# Patient Record
Sex: Male | Born: 1945 | Race: White | Hispanic: No | Marital: Married | Smoking: Former smoker
Health system: Southern US, Community
[De-identification: ages and names within clinical notes are randomized; demographics above are authoritative.]

## PROBLEM LIST (undated history)

## (undated) DIAGNOSIS — I10 Essential (primary) hypertension: Secondary | ICD-10-CM

## (undated) DIAGNOSIS — E7800 Pure hypercholesterolemia, unspecified: Secondary | ICD-10-CM

## (undated) DIAGNOSIS — F1020 Alcohol dependence, uncomplicated: Secondary | ICD-10-CM

## (undated) DIAGNOSIS — J45909 Unspecified asthma, uncomplicated: Secondary | ICD-10-CM

## (undated) HISTORY — PX: UMBILICAL HERNIA REPAIR: SHX196

## (undated) HISTORY — PX: INGUINAL HERNIA REPAIR: SUR1180

---

## 2003-01-18 ENCOUNTER — Ambulatory Visit (HOSPITAL_COMMUNITY): Admission: RE | Admit: 2003-01-18 | Discharge: 2003-01-18 | Payer: Self-pay | Admitting: Gastroenterology

## 2008-12-02 ENCOUNTER — Ambulatory Visit: Payer: Self-pay | Admitting: Diagnostic Radiology

## 2008-12-02 ENCOUNTER — Emergency Department (HOSPITAL_BASED_OUTPATIENT_CLINIC_OR_DEPARTMENT_OTHER): Admission: EM | Admit: 2008-12-02 | Discharge: 2008-12-02 | Payer: Self-pay | Admitting: Emergency Medicine

## 2009-09-13 ENCOUNTER — Emergency Department (HOSPITAL_BASED_OUTPATIENT_CLINIC_OR_DEPARTMENT_OTHER): Admission: EM | Admit: 2009-09-13 | Discharge: 2009-09-13 | Payer: Self-pay | Admitting: Emergency Medicine

## 2010-08-30 LAB — BASIC METABOLIC PANEL
CO2: 28 mEq/L (ref 19–32)
Calcium: 10.3 mg/dL (ref 8.4–10.5)
Chloride: 98 mEq/L (ref 96–112)
GFR calc Af Amer: >60 mL/min (ref >60)
Glucose, Bld: 113 mg/dL — ABNORMAL HIGH (ref 70–99)
Sodium: 139 mEq/L (ref 135–145)

## 2010-08-30 LAB — DIFFERENTIAL
Basophils Absolute: 0.1 10*3/uL (ref 0.0–0.1)
Basophils Relative: 1 % (ref 0–1)
Eosinophils Absolute: 0.2 10*3/uL (ref 0.0–0.7)
Eosinophils Relative: 2 % (ref 0–5)
Lymphocytes Relative: 31 % (ref 12–46)
Lymphs Abs: 2.4 K/uL (ref 0.7–4.0)
Monocytes Absolute: 0.6 10*3/uL (ref 0.1–1.0)
Monocytes Relative: 7 % (ref 3–12)
Neutro Abs: 4.4 10*3/uL (ref 1.7–7.7)
Neutrophils Relative %: 59 % (ref 43–77)

## 2010-08-30 LAB — URINALYSIS, ROUTINE W REFLEX MICROSCOPIC
Bilirubin Urine: NEGATIVE
Glucose, UA: NEGATIVE mg/dL
Hgb urine dipstick: NEGATIVE
Ketones, ur: NEGATIVE mg/dL
Nitrite: NEGATIVE
Protein, ur: NEGATIVE mg/dL
Specific Gravity, Urine: 1.011 (ref 1.005–1.030)
Urobilinogen, UA: 0.2 mg/dL (ref 0.0–1.0)
pH: 6.5 (ref 5.0–8.0)

## 2010-08-30 LAB — CBC
HCT: 49.5 % (ref 39.0–52.0)
Hemoglobin: 16.9 g/dL (ref 13.0–17.0)
MCHC: 34.1 g/dL (ref 30.0–36.0)
MCV: 96.1 fL (ref 78.0–100.0)
Platelets: 268 K/uL (ref 150–400)
RBC: 5.15 MIL/uL (ref 4.22–5.81)
RDW: 12.6 % (ref 11.5–15.5)
WBC: 7.7 K/uL (ref 4.0–10.5)

## 2010-08-30 LAB — PROTIME-INR
INR: 0.9 (ref 0.00–1.49)
Prothrombin Time: 12.1 s (ref 11.6–15.2)

## 2010-08-30 LAB — APTT: aPTT: 23 seconds — ABNORMAL LOW (ref 24–37)

## 2010-08-30 LAB — BASIC METABOLIC PANEL WITH GFR
BUN: 11 mg/dL (ref 6–23)
Creatinine, Ser: 0.9 mg/dL (ref 0.4–1.5)
GFR calc non Af Amer: >60 mL/min (ref >60)
Potassium: 3.9 meq/L (ref 3.5–5.1)

## 2010-10-09 NOTE — Op Note (Signed)
NAME:  Jay Wolfe, Jay Wolfe                         ACCOUNT NO.:  [id number]   MEDICAL RECORD NO.:  [id number]                   PATIENT TYPE:  AMB   LOCATION:  ENDO                                 FACILITY:  [facility]   PHYSICIAN:  Anselmo Rod, M.D.               DATE OF BIRTH:  1946/05/08   DATE OF PROCEDURE:  01/18/2003  DATE OF DISCHARGE:                                 OPERATIVE REPORT   PROCEDURE:  Colonoscopy/endoscopy.   ENDOSCOPIST:  Anselmo Rod, M.D.   INSTRUMENT:  Olympus videocoloscope.   INDICATIONS FOR PROCEDURE:  This 65 year old white male with a history of  rectal bleeding a questionable family history of colon cancer in his father.  Rule out colonic polyps, masses, etc.  Preprocedure preparation and an  informed consent was received from the patient.  The patient fasted for 8  hours prior to the procedure and prepped with a bottle of magnesium citrate  in a gallon of GoLYTELY the night prior to the procedure.   PREPROCEDURE PHYSICAL:  VITAL SIGNS: The patient with stable vital signs.  NECK:  Supple.  CHEST:  Clear to auscultation.  HEART:  S1, S2 regular.  ABDOMEN:  Soft with normal bowel sounds.   DESCRIPTION OF PROCEDURE:  The patient was placed in the left lateral  decubitus position and sedated with 75 mg of Demerol and 7.5 mg of Versed  intravenously.  Once the patient was adequately sedated he was maintained on  low-flow oxygen and continuous cardiac monitoring.  The Olympus  videocolonoscope was advanced from the rectum to cecum without difficulty.  The patient had an excellent prep. No masses, polyps, erosions, ulcerations  or a diverticula were seen.  The appendiceal orifice and ileocecal valve  were clearly visualized and photographed.   The patient had prominent inflamed hemorrhoids seen on retroflexion in the  rectum which I presume is the source of his rectal bleeding.  No masses,  polyps or diverticula were present.   IMPRESSION:  Prominent  internal hemorrhoid otherwise normal colonoscopy up  to the cecum.                  RECOMMENDATIONS:  1. Anusol HC 2.5% suppositories have been advised 1 per rectum q.h.s..  2. A high fiber diet with liberal fluid intake.  3. Repeat colonoscopy screening in the next 5 years unless the patient     develops abnormal symptoms in the interim.  4. Outpatient follow up if need arises in the future.                                               Anselmo Rod, M.D.    JNM/MEDQ  D:  01/18/2003  T:  01/19/2003  Job:  [id number]   cc:  Camie Patience  [address] [place]. [address]  [place] [address]  Fax: [phone]

## 2013-01-28 ENCOUNTER — Emergency Department (HOSPITAL_COMMUNITY)
Admission: EM | Admit: 2013-01-28 | Discharge: 2013-01-28 | Disposition: A | Discharge status: Home / Self Care | Payer: Medicare Other | Attending: Emergency Medicine | Admitting: Emergency Medicine

## 2013-01-28 ENCOUNTER — Emergency Department (HOSPITAL_COMMUNITY): Payer: Medicare Other

## 2013-01-28 ENCOUNTER — Encounter (HOSPITAL_COMMUNITY): Payer: Self-pay

## 2013-01-28 DIAGNOSIS — F1010 Alcohol abuse, uncomplicated: Secondary | ICD-10-CM | POA: Insufficient documentation

## 2013-01-28 DIAGNOSIS — S0003XA Contusion of scalp, initial encounter: Secondary | ICD-10-CM | POA: Insufficient documentation

## 2013-01-28 DIAGNOSIS — S0990XA Unspecified injury of head, initial encounter: Secondary | ICD-10-CM

## 2013-01-28 DIAGNOSIS — F10929 Alcohol use, unspecified with intoxication, unspecified: Secondary | ICD-10-CM

## 2013-01-28 DIAGNOSIS — W108XXA Fall (on) (from) other stairs and steps, initial encounter: Secondary | ICD-10-CM | POA: Insufficient documentation

## 2013-01-28 DIAGNOSIS — Z79899 Other long term (current) drug therapy: Secondary | ICD-10-CM | POA: Insufficient documentation

## 2013-01-28 DIAGNOSIS — Y92009 Unspecified place in unspecified non-institutional (private) residence as the place of occurrence of the external cause: Secondary | ICD-10-CM | POA: Insufficient documentation

## 2013-01-28 DIAGNOSIS — Y9389 Activity, other specified: Secondary | ICD-10-CM | POA: Insufficient documentation

## 2013-01-28 DIAGNOSIS — E7800 Pure hypercholesterolemia, unspecified: Secondary | ICD-10-CM | POA: Insufficient documentation

## 2013-01-28 DIAGNOSIS — I10 Essential (primary) hypertension: Secondary | ICD-10-CM | POA: Insufficient documentation

## 2013-01-28 HISTORY — DX: Alcohol dependence, uncomplicated: F10.20

## 2013-01-28 HISTORY — DX: Essential (primary) hypertension: I10

## 2013-01-28 HISTORY — DX: Pure hypercholesterolemia, unspecified: E78.00

## 2013-01-28 NOTE — ED Notes (Signed)
Pt from home.  Per EMS, pt has had 12 beers and fell down 3 stairs.  Pt with hematoma in the area of the L occipital lobe.  EMS reports pt ambulatory at the scene.  No spine pain.  Neuro intact.  GCS 15.  Pt c/o headache.

## 2013-01-28 NOTE — ED Provider Notes (Signed)
CSN: [id number]     Arrival date & time 01/28/13  1755 History   First MD Initiated Contact with Patient 01/28/13 1759     Chief Complaint  Patient presents with  . Fall    The history is provided by the patient.   as reported that the patient was walking up the steps to his house he fell backward striking his posterior scalp.  No laceration.  Hematoma noted.  The patient is ambulatory at the scene.  The patient was not immobilized prior to arrival.  On arrival of emergency apartment he reports pain in his posterior scalp as well as his neck.  He denies weakness of his upper lower extremities.  His pain is mild to moderate in severity.  His pain is worsened by palpation.  Nothing improves his pain.  He denies chest pain shortness of breath.  He has no abdominal pain.  No recent illness.  He does admit to drinking alcohol today.  Past Medical History  Diagnosis Date  . Alcoholic   . Hypertension   . High cholesterol    History reviewed. No pertinent past surgical history. No family history on file. History  Substance Use Topics  . Smoking status: Never Smoker   . Smokeless tobacco: Not on file  . Alcohol Use: Yes    Review of Systems  All other systems reviewed and are negative.    Allergies  Review of patient's allergies indicates no known allergies.  Home Medications   Current Outpatient Rx  Name  Route  Sig  Dispense  Refill  . benzonatate (TESSALON) 200 MG capsule   Oral   Take 200 mg by mouth 3 (three) times daily as needed for cough.         . hydrochlorothiazide (HYDRODIURIL) 25 MG tablet   Oral   Take 25 mg by mouth daily.         . metoprolol succinate (TOPROL-XL) 50 MG 24 hr tablet   Oral   Take 50 mg by mouth daily. Take with or immediately following a meal.         . pravastatin (PRAVACHOL) 40 MG tablet   Oral   Take 40 mg by mouth every evening.         . venlafaxine XR (EFFEXOR-XR) 37.5 MG 24 hr capsule   Oral   Take 37.5 mg by mouth daily.          BP 134/76  Temp(Src) 97.6 F (36.4 C) (Oral)  Resp 20  Ht 5' 8.5" (1.74 m)  Wt 222 lb (100.699 kg)  BMI 33.26 kg/m2  SpO2 97% Physical Exam  Nursing note and vitals reviewed. Constitutional: He is oriented to person, place, and time. He appears well-developed and well-nourished.  HENT:  Head: Normocephalic and atraumatic.  Large boggy hematoma of his posterior scalp.  No laceration.  Eyes: EOM are normal.  Neck: Neck supple.  Lysis collar on arrival to the emergency apartment.  Mild cervical and paracervical tenderness without step-off.  Cardiovascular: Normal rate, regular rhythm, normal heart sounds and intact distal pulses.   Pulmonary/Chest: Effort normal and breath sounds normal. No respiratory distress.  Abdominal: Soft. He exhibits no distension. There is no tenderness.  Genitourinary: Rectum normal.  Musculoskeletal: Normal range of motion.  Thoracic or lumbar spinal tenderness.  Full range of motion bilateral hips knees and ankles.  Full range of motion bilateral wrists elbows and shoulders.  No tenderness his clavicles  Neurological: He is alert and oriented to person,  place, and time.  Skin: Skin is warm and dry.  Psychiatric: He has a normal mood and affect. Judgment normal.    ED Course  Procedures (including critical care time) Labs Review Labs Reviewed - No data to display Imaging Review Ct Head Wo Contrast  01/28/2013   CLINICAL DATA:  Fall with injury to the back of the head. Amnestic to event.  EXAM: CT HEAD WITHOUT CONTRAST  CT CERVICAL SPINE WITHOUT CONTRAST  TECHNIQUE: Multidetector CT imaging of the head and cervical spine was performed following the standard protocol without intravenous contrast. Multiplanar CT image reconstructions of the cervical spine were also generated.  COMPARISON:  12/02/2008  FINDINGS: CT HEAD FINDINGS  Left parietal scalp hematoma. The brainstem, cerebellum, cerebral peduncles, thalamus, basal ganglia, basilar cisterns, and  ventricular system appear within normal limits. No intracranial hemorrhage, mass lesion, or acute CVA. 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  CT CERVICAL SPINE FINDINGS  Cervical spondylosis and degenerative disk disease noted with loss of disc height at C3-4 and C6-7, and uncinate spurring causing right foraminal stenosis at C6-7. No fracture or significant malalignment. Advanced degenerative findings at the anterior C1-2 articulation.  IMPRESSION: CT HEAD IMPRESSION  1. Left parietal scalp hematoma. No acute intracranial findings. 2. 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  CT CERVICAL SPINE IMPRESSION  1. Cervical spondylosis and degenerative disc disease, causing right foraminal stenosis at C6-7. No fracture or acute subluxation in the cervical spine identified.   Electronically Signed   By: Herbie Baltimore   On: 01/28/2013 20:09   Ct Cervical Spine Wo Contrast  01/28/2013   CLINICAL DATA:  Fall with injury to the back of the head. Amnestic to event.  EXAM: CT HEAD WITHOUT CONTRAST  CT CERVICAL SPINE WITHOUT CONTRAST  TECHNIQUE: Multidetector CT imaging of the head and cervical spine was performed following the standard protocol without intravenous contrast. Multiplanar CT image reconstructions of the cervical spine were also generated.  COMPARISON:  12/02/2008  FINDINGS: CT HEAD FINDINGS  Left parietal scalp hematoma. The brainstem, cerebellum, cerebral peduncles, thalamus, basal ganglia, basilar cisterns, and ventricular system appear within normal limits. No intracranial hemorrhage, mass lesion, or acute CVA. 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  CT CERVICAL SPINE FINDINGS  Cervical spondylosis and degenerative disk disease noted with loss of disc height at C3-4 and C6-7, and uncinate spurring causing right foraminal stenosis at C6-7. No fracture or significant malalignment. Advanced degenerative findings at the anterior C1-2 articulation.  IMPRESSION: CT HEAD  IMPRESSION  1. Left parietal scalp hematoma. No acute intracranial findings. 2. 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  CT CERVICAL SPINE IMPRESSION  1. Cervical spondylosis and degenerative disc disease, causing right foraminal stenosis at C6-7. No fracture or acute subluxation in the cervical spine identified.   Electronically Signed   By: Herbie Baltimore   On: 01/28/2013 20:09   I personally reviewed the imaging tests through PACS system I reviewed available ER/hospitalization records through the EMR   MDM   1. Head injury, initial encounter   2. Alcohol intoxication    CT head C-spine without acute pathology.  Patient in Lipitor in the emergency department.  I spoke with the patient's wife and updated her.  Patient reports that he will take a taxicab home.  He came in later on ERCP.  No other injury.    Lyanne Co, MD 01/28/13 2046

## 2013-01-28 NOTE — ED Notes (Signed)
Pt transported to CT ?

## 2013-04-27 ENCOUNTER — Emergency Department (HOSPITAL_COMMUNITY)
Admission: EM | Admit: 2013-04-27 | Discharge: 2013-04-27 | Disposition: A | Discharge status: Home / Self Care | Payer: Medicare Other | Attending: Emergency Medicine | Admitting: Emergency Medicine

## 2013-04-27 ENCOUNTER — Emergency Department (HOSPITAL_COMMUNITY): Payer: Medicare Other

## 2013-04-27 ENCOUNTER — Encounter (HOSPITAL_COMMUNITY): Payer: Self-pay | Admitting: Emergency Medicine

## 2013-04-27 DIAGNOSIS — K08109 Complete loss of teeth, unspecified cause, unspecified class: Secondary | ICD-10-CM | POA: Insufficient documentation

## 2013-04-27 DIAGNOSIS — S0081XA Abrasion of other part of head, initial encounter: Secondary | ICD-10-CM

## 2013-04-27 DIAGNOSIS — F10929 Alcohol use, unspecified with intoxication, unspecified: Secondary | ICD-10-CM

## 2013-04-27 DIAGNOSIS — S0083XA Contusion of other part of head, initial encounter: Secondary | ICD-10-CM

## 2013-04-27 DIAGNOSIS — S0003XA Contusion of scalp, initial encounter: Secondary | ICD-10-CM | POA: Insufficient documentation

## 2013-04-27 DIAGNOSIS — IMO0002 Reserved for concepts with insufficient information to code with codable children: Secondary | ICD-10-CM | POA: Insufficient documentation

## 2013-04-27 DIAGNOSIS — F1010 Alcohol abuse, uncomplicated: Secondary | ICD-10-CM | POA: Insufficient documentation

## 2013-04-27 DIAGNOSIS — Y9301 Activity, walking, marching and hiking: Secondary | ICD-10-CM | POA: Insufficient documentation

## 2013-04-27 DIAGNOSIS — Y92009 Unspecified place in unspecified non-institutional (private) residence as the place of occurrence of the external cause: Secondary | ICD-10-CM | POA: Insufficient documentation

## 2013-04-27 DIAGNOSIS — Z79899 Other long term (current) drug therapy: Secondary | ICD-10-CM | POA: Insufficient documentation

## 2013-04-27 DIAGNOSIS — I10 Essential (primary) hypertension: Secondary | ICD-10-CM | POA: Insufficient documentation

## 2013-04-27 DIAGNOSIS — E7800 Pure hypercholesterolemia, unspecified: Secondary | ICD-10-CM | POA: Insufficient documentation

## 2013-04-27 LAB — CBC
Hemoglobin: 15.5 g/dL (ref 13.0–17.0)
MCH: 35.5 pg — ABNORMAL HIGH (ref 26.0–34.0)
MCV: 99.1 fL (ref 78.0–100.0)
Platelets: 204 10*3/uL (ref 150–400)
RBC: 4.37 MIL/uL (ref 4.22–5.81)
WBC: 4.1 10*3/uL (ref 4.0–10.5)

## 2013-04-27 LAB — BASIC METABOLIC PANEL
CO2: 25 mEq/L (ref 19–32)
Calcium: 9.1 mg/dL (ref 8.4–10.5)
Chloride: 102 mEq/L (ref 96–112)
Creatinine, Ser: 1.02 mg/dL (ref 0.50–1.35)
Glucose, Bld: 109 mg/dL — ABNORMAL HIGH (ref 70–99)

## 2013-04-27 MED ORDER — OXYCODONE-ACETAMINOPHEN 5-325 MG PO TABS
1.0000 | ORAL_TABLET | Freq: Once | ORAL | Status: AC
  Administered 2013-04-27: 1 via ORAL
  Filled 2013-04-27: qty 1

## 2013-04-27 NOTE — ED Notes (Signed)
Bed: [facility] Expected date:  Expected time:  Means of arrival:  Comments: EMS-ETOH 

## 2013-04-27 NOTE — ED Notes (Signed)
per [facility], pt intoxicated, apartment complex residents report that pt fell w/facial trauma w/possible dental damage. Pt ambulates but very unsteady. Upon EMS arrival, pt was inside house, alert, awake, responsive, alert and oriented x 4. Pt is very lethargic, but neuro checks intact. No active bleeding, but some facial edema/swelling.

## 2013-04-27 NOTE — ED Provider Notes (Signed)
CSN: [id number]     Arrival date & time 04/27/13  1802 History   First MD Initiated Contact with Patient 04/27/13 1820     Chief Complaint  Patient presents with  . Alcohol Intoxication  . Fall    facial injury with possible dental damage   (Consider location/radiation/quality/duration/timing/severity/associated sxs/prior Treatment) HPI Patient reports he was walking today when he tripped on file and injured his face.  He was drinking alcohol today.  He reports pain to his axilla.  He denies weakness of his upper lower extremities.  His pain is moderate in severity this time.  He reports mild neck pain.  Denies chest pain shortness breath.  No abdominal pain.  He denies use of anticoagulants.  Her preceding chest pain or palpitations.   Past Medical History  Diagnosis Date  . Alcoholic   . Hypertension   . High cholesterol    History reviewed. No pertinent past surgical history. History reviewed. No pertinent family history. History  Substance Use Topics  . Smoking status: Never Smoker   . Smokeless tobacco: Not on file  . Alcohol Use: Yes    Review of Systems  All other systems reviewed and are negative.    Allergies  Review of patient's allergies indicates no known allergies.  Home Medications   Current Outpatient Rx  Name  Route  Sig  Dispense  Refill  . metoprolol succinate (TOPROL-XL) 50 MG 24 hr tablet   Oral   Take 50 mg by mouth daily. Take with or immediately following a meal.         . benzonatate (TESSALON) 200 MG capsule   Oral   Take 200 mg by mouth 3 (three) times daily as needed for cough.         . pravastatin (PRAVACHOL) 40 MG tablet   Oral   Take 40 mg by mouth every evening.         . venlafaxine XR (EFFEXOR-XR) 37.5 MG 24 hr capsule   Oral   Take 37.5 mg by mouth daily.          BP 135/68  Pulse 68  Temp(Src) 97.9 F (36.6 C) (Oral)  Resp 18  SpO2 100% Physical Exam  Nursing note and vitals reviewed. Constitutional: He is  oriented to person, place, and time. He appears well-developed and well-nourished.  HENT:  Head: Normocephalic.  Multiple superficial facial abrasions involving his nose, his maxilla, his chin.  No obvious lacerations noted.  Poor dentition at baseline.  Absence of tooth #8 and Ellis 2 fracture of #9, the patient reports is baseline for him.  No obvious dental intrusion or subluxation.  Mild trismus without malocclusion  Eyes: EOM are normal. Pupils are equal, round, and reactive to light.  Neck: Neck supple.  Mild cervical and paracervical tenderness without cervical step-offs.  Cardiovascular: Normal rate, regular rhythm, normal heart sounds and intact distal pulses.   Pulmonary/Chest: Effort normal and breath sounds normal. No respiratory distress.  Abdominal: Soft. He exhibits no distension. There is no tenderness.  Musculoskeletal: Normal range of motion.  Neurological: He is alert and oriented to person, place, and time.  5/5 strength in major muscle groups of  bilateral upper and lower extremities. Speech normal. No facial asymetry.   Skin: Skin is warm and dry.  Psychiatric: He has a normal mood and affect. Judgment normal.    ED Course  Procedures (including critical care time) Labs Review Labs Reviewed  ETHANOL - Abnormal; Notable for the following:  Alcohol, Ethyl (B) 379 (*)    All other components within normal limits  CBC - Abnormal; Notable for the following:    MCH 35.5 (*)    All other components within normal limits  BASIC METABOLIC PANEL - Abnormal; Notable for the following:    Glucose, Bld 109 (*)    GFR calc non Af Amer 75 (*)    GFR calc Af Amer 87 (*)    All other components within normal limits   Imaging Review Ct Head Wo Contrast  04/27/2013   CLINICAL DATA:  Facial trauma and possible dental damage following a fall.  EXAM: CT HEAD WITHOUT CONTRAST  CT MAXILLOFACIAL WITHOUT CONTRAST  CT CERVICAL SPINE WITHOUT CONTRAST  TECHNIQUE: Multidetector CT imaging  of the head, cervical spine, and maxillofacial structures were performed using the standard protocol without intravenous contrast. Multiplanar CT image reconstructions of the cervical spine and maxillofacial structures were also generated.  COMPARISON:  Head and cervical spine CT dated 01/28/2013.  FINDINGS: CT HEAD FINDINGS  Stable enlarged ventricles and subarachnoid spaces. Mild patchy white matter low density in both cerebral hemispheres with little change. Left posterior scalp scar at the location of the previously seen hematoma. No skull fracture, intracranial hemorrhage or paranasal sinus air-fluid levels.  CT MAXILLOFACIAL FINDINGS  The facial bones are intact with no fractures or paranasal sinus air-fluid levels. Right inferior maxillary sinus mucosal thickening and retention cyst formation. The maximum mucosal thickness is 6 mm.  CT CERVICAL SPINE FINDINGS  Stable mild interstitial scarring at the medial left lung apex. Multilevel degenerative changes. No prevertebral soft tissue swelling, fractures or acute subluxations.  IMPRESSION: 1. No acute abnormality involving the head, facial bones or cervical spine. 2. Stable mild atrophy and mild chronic small vessel white matter ischemic changes in both cerebral hemispheres. 3. Stable multilevel degenerative changes in the cervical spine. 4. Mild to moderate chronic right maxillary sinusitis.   Electronically Signed   By: Gordan Payment M.D.   On: 04/27/2013 19:43   Ct Cervical Spine Wo Contrast  04/27/2013   CLINICAL DATA:  Facial trauma and possible dental damage following a fall.  EXAM: CT HEAD WITHOUT CONTRAST  CT MAXILLOFACIAL WITHOUT CONTRAST  CT CERVICAL SPINE WITHOUT CONTRAST  TECHNIQUE: Multidetector CT imaging of the head, cervical spine, and maxillofacial structures were performed using the standard protocol without intravenous contrast. Multiplanar CT image reconstructions of the cervical spine and maxillofacial structures were also generated.   COMPARISON:  Head and cervical spine CT dated 01/28/2013.  FINDINGS: CT HEAD FINDINGS  Stable enlarged ventricles and subarachnoid spaces. Mild patchy white matter low density in both cerebral hemispheres with little change. Left posterior scalp scar at the location of the previously seen hematoma. No skull fracture, intracranial hemorrhage or paranasal sinus air-fluid levels.  CT MAXILLOFACIAL FINDINGS  The facial bones are intact with no fractures or paranasal sinus air-fluid levels. Right inferior maxillary sinus mucosal thickening and retention cyst formation. The maximum mucosal thickness is 6 mm.  CT CERVICAL SPINE FINDINGS  Stable mild interstitial scarring at the medial left lung apex. Multilevel degenerative changes. No prevertebral soft tissue swelling, fractures or acute subluxations.  IMPRESSION: 1. No acute abnormality involving the head, facial bones or cervical spine. 2. Stable mild atrophy and mild chronic small vessel white matter ischemic changes in both cerebral hemispheres. 3. Stable multilevel degenerative changes in the cervical spine. 4. Mild to moderate chronic right maxillary sinusitis.   Electronically Signed   By: Ardeth Perfect.D.  On: 04/27/2013 19:43   Ct Maxillofacial Wo Cm  04/27/2013   CLINICAL DATA:  Facial trauma and possible dental damage following a fall.  EXAM: CT HEAD WITHOUT CONTRAST  CT MAXILLOFACIAL WITHOUT CONTRAST  CT CERVICAL SPINE WITHOUT CONTRAST  TECHNIQUE: Multidetector CT imaging of the head, cervical spine, and maxillofacial structures were performed using the standard protocol without intravenous contrast. Multiplanar CT image reconstructions of the cervical spine and maxillofacial structures were also generated.  COMPARISON:  Head and cervical spine CT dated 01/28/2013.  FINDINGS: CT HEAD FINDINGS  Stable enlarged ventricles and subarachnoid spaces. Mild patchy white matter low density in both cerebral hemispheres with little change. Left posterior scalp scar  at the location of the previously seen hematoma. No skull fracture, intracranial hemorrhage or paranasal sinus air-fluid levels.  CT MAXILLOFACIAL FINDINGS  The facial bones are intact with no fractures or paranasal sinus air-fluid levels. Right inferior maxillary sinus mucosal thickening and retention cyst formation. The maximum mucosal thickness is 6 mm.  CT CERVICAL SPINE FINDINGS  Stable mild interstitial scarring at the medial left lung apex. Multilevel degenerative changes. No prevertebral soft tissue swelling, fractures or acute subluxations.  IMPRESSION: 1. No acute abnormality involving the head, facial bones or cervical spine. 2. Stable mild atrophy and mild chronic small vessel white matter ischemic changes in both cerebral hemispheres. 3. Stable multilevel degenerative changes in the cervical spine. 4. Mild to moderate chronic right maxillary sinusitis.   Electronically Signed   By: Gordan Payment M.D.   On: 04/27/2013 19:43  I personally reviewed the imaging tests through PACS system I reviewed available ER/hospitalization records through the EMR   EKG Interpretation   None       MDM   1. Facial contusion, initial encounter   2. Alcohol intoxication   3. Facial abrasion, initial encounter    CT head, C-spine, CT maxillofacial R. without fracture.  Patient feels much better at this time.  He is ambulatory in the emergency department.  I spoke with the patient's wife updated her.  Patient take a taxicab home.    Lyanne Co, MD 04/27/13 [phone]

## 2013-09-20 ENCOUNTER — Encounter (HOSPITAL_BASED_OUTPATIENT_CLINIC_OR_DEPARTMENT_OTHER): Payer: Self-pay | Admitting: Emergency Medicine

## 2013-09-20 ENCOUNTER — Emergency Department (HOSPITAL_BASED_OUTPATIENT_CLINIC_OR_DEPARTMENT_OTHER)
Admission: EM | Admit: 2013-09-20 | Discharge: 2013-09-20 | Disposition: A | Discharge status: Home / Self Care | Payer: Medicare Other | Attending: Emergency Medicine | Admitting: Emergency Medicine

## 2013-09-20 DIAGNOSIS — E7800 Pure hypercholesterolemia, unspecified: Secondary | ICD-10-CM | POA: Insufficient documentation

## 2013-09-20 DIAGNOSIS — T44905A Adverse effect of unspecified drugs primarily affecting the autonomic nervous system, initial encounter: Secondary | ICD-10-CM | POA: Insufficient documentation

## 2013-09-20 DIAGNOSIS — Z79899 Other long term (current) drug therapy: Secondary | ICD-10-CM | POA: Insufficient documentation

## 2013-09-20 DIAGNOSIS — I10 Essential (primary) hypertension: Secondary | ICD-10-CM | POA: Insufficient documentation

## 2013-09-20 DIAGNOSIS — T783XXA Angioneurotic edema, initial encounter: Secondary | ICD-10-CM | POA: Insufficient documentation

## 2013-09-20 DIAGNOSIS — T464X5A Adverse effect of angiotensin-converting-enzyme inhibitors, initial encounter: Secondary | ICD-10-CM

## 2013-09-20 LAB — BASIC METABOLIC PANEL
BUN: 7 mg/dL (ref 6–23)
CHLORIDE: 102 meq/L (ref 96–112)
CO2: 25 mEq/L (ref 19–32)
CREATININE: 1 mg/dL (ref 0.50–1.35)
Calcium: 9.5 mg/dL (ref 8.4–10.5)
GFR, EST AFRICAN AMERICAN: 89 mL/min — AB (ref >90)
GFR, EST NON AFRICAN AMERICAN: 76 mL/min — AB (ref >90)
Glucose, Bld: 106 mg/dL — ABNORMAL HIGH (ref 70–99)
Potassium: 3.8 mEq/L (ref 3.7–5.3)
Sodium: 140 mEq/L (ref 137–147)

## 2013-09-20 LAB — CBC WITH DIFFERENTIAL/PLATELET
BASOS ABS: 0.1 10*3/uL (ref 0.0–0.1)
Basophils Relative: 1 % (ref 0–1)
Eosinophils Absolute: 0.3 10*3/uL (ref 0.0–0.7)
Eosinophils Relative: 5 % (ref 0–5)
HEMATOCRIT: 39.5 % (ref 39.0–52.0)
Hemoglobin: 13.6 g/dL (ref 13.0–17.0)
LYMPHS PCT: 46 % (ref 12–46)
Lymphs Abs: 2.4 10*3/uL (ref 0.7–4.0)
MCH: 35.7 pg — ABNORMAL HIGH (ref 26.0–34.0)
MCHC: 34.4 g/dL (ref 30.0–36.0)
MCV: 103.7 fL — ABNORMAL HIGH (ref 78.0–100.0)
MONO ABS: 0.5 10*3/uL (ref 0.1–1.0)
Monocytes Relative: 10 % (ref 3–12)
NEUTROS ABS: 2 10*3/uL (ref 1.7–7.7)
NEUTROS PCT: 38 % — AB (ref 43–77)
PLATELETS: 146 10*3/uL — AB (ref 150–400)
RBC: 3.81 MIL/uL — ABNORMAL LOW (ref 4.22–5.81)
RDW: 12.5 % (ref 11.5–15.5)
WBC: 5.2 10*3/uL (ref 4.0–10.5)

## 2013-09-20 MED ORDER — DIPHENHYDRAMINE HCL 50 MG/ML IJ SOLN
50.0000 mg | Freq: Once | INTRAMUSCULAR | Status: AC
  Administered 2013-09-20: 50 mg via INTRAVENOUS
  Filled 2013-09-20: qty 1

## 2013-09-20 MED ORDER — DEXAMETHASONE SODIUM PHOSPHATE 10 MG/ML IJ SOLN
10.0000 mg | Freq: Once | INTRAMUSCULAR | Status: AC
  Administered 2013-09-20: 10 mg via INTRAVENOUS
  Filled 2013-09-20: qty 1

## 2013-09-20 MED ORDER — AMLODIPINE BESYLATE 10 MG PO TABS: ORAL_TABLET | ORAL | Status: DC

## 2013-09-20 MED ORDER — FAMOTIDINE IN NACL 20-0.9 MG/50ML-% IV SOLN
20.0000 mg | Freq: Once | INTRAVENOUS | Status: AC
  Administered 2013-09-20: 20 mg via INTRAVENOUS
  Filled 2013-09-20: qty 50

## 2013-09-20 MED ORDER — AMLODIPINE BESYLATE 10 MG PO TABS: 10.0000 mg | ORAL_TABLET | Freq: Every day | ORAL | Status: DC

## 2013-09-20 NOTE — ED Notes (Signed)
Tongue is less swollen   Pt states no discomfort

## 2013-09-20 NOTE — ED Provider Notes (Signed)
CSN: [id number]     Arrival date & time 09/20/13  [id number] History   First MD Initiated Contact with Patient 09/20/13 [phone]     Chief Complaint  Patient presents with  . Oral Swelling     (Consider location/radiation/quality/duration/timing/severity/associated sxs/prior Treatment) HPI This is a 68 year old male who takes Lotrel every morning. Lotrel contains benazepril, an ACE inhibitor. He is here with swelling of his tongue that awakened him from sleep about an hour ago. There is mild to moderate pain associated with it. He is not having any difficulty speaking or breathing. He has never had this happen before.  Past Medical History  Diagnosis Date  . Alcoholic   . Hypertension   . High cholesterol    History reviewed. No pertinent past surgical history. No family history on file. History  Substance Use Topics  . Smoking status: Never Smoker   . Smokeless tobacco: Not on file  . Alcohol Use: Yes    Review of Systems  All other systems reviewed and are negative.  Allergies  Review of patient's allergies indicates no known allergies.  Home Medications   Prior to Admission medications   Medication Sig Start Date End Date Taking? Authorizing Provider  benzonatate (TESSALON) 200 MG capsule Take 200 mg by mouth 3 (three) times daily as needed for cough.    Historical Provider, MD  metoprolol succinate (TOPROL-XL) 50 MG 24 hr tablet Take 50 mg by mouth daily. Take with or immediately following a meal.    Historical Provider, MD  pravastatin (PRAVACHOL) 40 MG tablet Take 40 mg by mouth every evening.    Historical Provider, MD  venlafaxine XR (EFFEXOR-XR) 37.5 MG 24 hr capsule Take 37.5 mg by mouth daily.    Historical Provider, MD   BP 137/83  Pulse 66  Temp(Src) 98.1 F (36.7 C) (Oral)  Resp 20  Ht 5\' 8"  (1.727 m)  Wt 226 lb (102.513 kg)  BMI 34.37 kg/m2  SpO2 99%  Physical Exam General: Well-developed, well-nourished male in no acute distress; appearance consistent with  age of record HENT: normocephalic; atraumatic; angioedema of the bone, right greater than left; no stridor; no dysphonia; no pharyngeal edema Eyes: pupils equal, round and reactive to light; extraocular muscles intact Neck: supple Heart: regular rate and rhythm Lungs: clear to auscultation bilaterally Abdomen: soft; nondistended; nontender; bowel sounds present Extremities: No deformity; full range of motion; pulses normal Neurologic: Awake, alert and oriented; motor function intact in all extremities and symmetric; no facial droop Skin: Warm and dry Psychiatric: Normal mood and affect    ED Course  Procedures (including critical care time)   MDM   Nursing notes and vitals signs, including pulse oximetry, reviewed.  Summary of this visit's results, reviewed by myself:  Labs:  Results for orders placed during the hospital encounter of 09/20/13 (from the past 24 hour(s))  BASIC METABOLIC PANEL     Status: Abnormal   Collection Time    09/20/13  4:00 AM      Result Value Ref Range   Sodium 140  137 - 147 mEq/L   Potassium 3.8  3.7 - 5.3 mEq/L   Chloride 102  96 - 112 mEq/L   CO2 25  19 - 32 mEq/L   Glucose, Bld 106 (*) 70 - 99 mg/dL   BUN 7  6 - 23 mg/dL   Creatinine, Ser 4.781.00  0.50 - 1.35 mg/dL   Calcium 9.5  8.4 - 29.510.5 mg/dL   GFR calc non Af [place]  76 (*) >90 mL/min   GFR calc Af Amer 89 (*) >90 mL/min  CBC WITH DIFFERENTIAL     Status: Abnormal   Collection Time    09/20/13  4:00 AM      Result Value Ref Range   WBC 5.2  4.0 - 10.5 K/uL   RBC 3.81 (*) 4.22 - 5.81 MIL/uL   Hemoglobin 13.6  13.0 - 17.0 g/dL   HCT 16.139.5  09.639.0 - 04.552.0 %   MCV 103.7 (*) 78.0 - 100.0 fL   MCH 35.7 (*) 26.0 - 34.0 pg   MCHC 34.4  30.0 - 36.0 g/dL   RDW 40.912.5  81.111.5 - 91.415.5 %   Platelets 146 (*) 150 - 400 K/uL   Neutrophils Relative % 38 (*) 43 - 77 %   Neutro Abs 2.0  1.7 - 7.7 K/uL   Lymphocytes Relative 46  12 - 46 %   Lymphs Abs 2.4  0.7 - 4.0 K/uL   Monocytes Relative 10  3 - 12 %    Monocytes Absolute 0.5  0.1 - 1.0 K/uL   Eosinophils Relative 5  0 - 5 %   Eosinophils Absolute 0.3  0.0 - 0.7 K/uL   Basophils Relative 1  0 - 1 %   Basophils Absolute 0.1  0.0 - 0.1 K/uL    4:39 AM Given Benadryl, Pepcid and dexamethasone IV.  5:26 AM Feels better. Some objective improvement in swelling. Has been sleeping without breathing difficulty or desaturation.  6:38 AM And subjective. Patient is discomfort, no difficulty speaking no difficulty breathing. We'll write for plain amlodipine and advised him to discontinue the enalapril and contact his primary care physician Dr. Reola CalkinsBeck later this morning. He was advised to return forwarding swelling.        Hanley SeamenJohn L Alexxia Stankiewicz, MD 09/20/13 [phone]

## 2013-09-20 NOTE — ED Notes (Signed)
Swelling to tongue and rt side of face onset about 1 hour ago  Took 1/2 mg xanax around 10 last pm,  Throat hurting

## 2013-09-20 NOTE — ED Notes (Signed)
Woke about 1 hr ago w tongue swelling,  No diff talking or breathing

## 2013-09-20 NOTE — ED Notes (Signed)
Pt states feels as if swelling in tongue is going down

## 2013-12-03 ENCOUNTER — Telehealth: Payer: Self-pay | Admitting: Hematology & Oncology

## 2013-12-03 NOTE — Telephone Encounter (Signed)
Made appt and spoke w NEW PATIENT today to remind them of their appointment with Dr. Ennever. Also, advised them to bring all medication bottles and insurance card information. ° °

## 2014-01-04 ENCOUNTER — Telehealth: Payer: Self-pay | Admitting: Hematology & Oncology

## 2014-01-04 NOTE — Telephone Encounter (Signed)
Left vm w NEW PATIENT today to remind them of their appointment with Dr. Ennever. Also, advised them to bring all medication bottles and insurance card information. ° °

## 2014-01-07 ENCOUNTER — Ambulatory Visit (HOSPITAL_BASED_OUTPATIENT_CLINIC_OR_DEPARTMENT_OTHER): Payer: Medicare Other | Admitting: Family

## 2014-01-07 ENCOUNTER — Ambulatory Visit (HOSPITAL_BASED_OUTPATIENT_CLINIC_OR_DEPARTMENT_OTHER): Payer: Medicare Other | Admitting: Lab

## 2014-01-07 ENCOUNTER — Ambulatory Visit: Payer: Medicare Other

## 2014-01-07 ENCOUNTER — Encounter: Payer: Self-pay | Admitting: Family

## 2014-01-07 VITALS — BP 145/83 | HR 52 | Temp 98.0°F | Resp 20 | Ht 68.0 in | Wt 222.0 lb

## 2014-01-07 DIAGNOSIS — R718 Other abnormality of red blood cells: Secondary | ICD-10-CM

## 2014-01-07 DIAGNOSIS — D7589 Other specified diseases of blood and blood-forming organs: Secondary | ICD-10-CM

## 2014-01-07 LAB — IRON AND TIBC CHCC
%SAT: 21 % (ref 20–55)
IRON: 52 ug/dL (ref 42–163)
TIBC: 249 ug/dL (ref 202–409)
UIBC: 197 ug/dL (ref 117–376)

## 2014-01-07 LAB — CBC WITH DIFFERENTIAL (CANCER CENTER ONLY)
BASO#: 0 10*3/uL (ref 0.0–0.2)
BASO%: 0.5 % (ref 0.0–2.0)
EOS%: 2.5 % (ref 0.0–7.0)
Eosinophils Absolute: 0.2 10*3/uL (ref 0.0–0.5)
HCT: 38.1 % — ABNORMAL LOW (ref 38.7–49.9)
HGB: 12.9 g/dL — ABNORMAL LOW (ref 13.0–17.1)
LYMPH#: 2.6 10*3/uL (ref 0.9–3.3)
LYMPH%: 31.6 % (ref 14.0–48.0)
MCH: 33.7 pg — ABNORMAL HIGH (ref 28.0–33.4)
MCHC: 33.9 g/dL (ref 32.0–35.9)
MCV: 100 fL — AB (ref 82–98)
MONO#: 0.5 10*3/uL (ref 0.1–0.9)
MONO%: 6.1 % (ref 0.0–13.0)
NEUT#: 4.8 10*3/uL (ref 1.5–6.5)
NEUT%: 59.3 % (ref 40.0–80.0)
PLATELETS: 156 10*3/uL (ref 145–400)
RBC: 3.83 10*6/uL — ABNORMAL LOW (ref 4.20–5.70)
RDW: 13 % (ref 11.1–15.7)
WBC: 8.1 10*3/uL (ref 4.0–10.0)

## 2014-01-07 LAB — FERRITIN CHCC: Ferritin: 1288 ng/ml — ABNORMAL HIGH (ref 22–316)

## 2014-01-07 LAB — CHCC SATELLITE - SMEAR

## 2014-01-07 MED ORDER — FOLIC ACID 1 MG PO TABS: 2.0000 mg | ORAL_TABLET | Freq: Every day | ORAL | Status: DC

## 2014-01-07 MED ORDER — VITAMIN B-1 100 MG PO TABS: 100.0000 mg | ORAL_TABLET | Freq: Every day | ORAL | Status: AC

## 2014-01-07 MED ORDER — FOLIC ACID 1 MG PO TABS: 2.0000 mg | ORAL_TABLET | Freq: Every day | ORAL | Status: AC

## 2014-01-07 NOTE — Progress Notes (Signed)
Hematology/Oncology Consultation   Name: Jay Wolfe      MRN: [id number]    Location: Room/bed info not found  Date: 01/07/2014 Time:1:37 PM   REFERRING PHYSICIAN:  Chesley Mires  REASON FOR CONSULT:  Elevated MCV   DIAGNOSIS:  Elevated MCV  HISTORY OF PRESENT ILLNESS:  Jay Wolfe is a very pleasant 68 yo mae with an elevated MCV. He has had an elevated MCV since October 2014. They have ranged from 103.3 -104.7. Today it is 100. He has no family history of blood disorders or cancer. He does have gout in his right foot and high cholesterol. He quit drinking alcohol 2 months ago. He does not smoke. He quit in 1974 but he states that his wife "smokes like a smokestack". He has a broken tooth and is on Amoxicillin. He is having it pulled tomorrow. He is under a lot of stress with his family and wife. This has taken a toll on him. He states that his energy is good. His appetite is good and he is staying hydrated. He denies fever, chills, n/v, cough, rash, headache, dizziness, SOB, chest pain, palpitations, abdominal pain, constipation, diarrhea, blood in urine or stool. He has had no pain or bleeding. His ankles are a little swollen from the gout. He takes Endocine for this. He denies any swelling, tenderness, numbness or tingling in his other extremities. He was born in [place], [place] and was raised as an Charity fundraiser" [place]. 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 He also worked on a [employer] in [place]. He has lived in [place] since 1996. Overall, he is doing very well.    ROS: All other 10 point review of systems is negative.   PAST MEDICAL HISTORY:   Past Medical History  Diagnosis Date  . Alcoholic   . Hypertension   . High cholesterol    ALLERGIES: Allergies  Allergen Reactions  . Sulfa Antibiotics Anaphylaxis    Turn blue,fever, 109temp  . Benazepril Swelling    Other reaction(s): ANGIOEDEMA Angioedema of tongue  .  Simvastatin    MEDICATIONS:  Current Outpatient Prescriptions on File Prior to Visit  Medication Sig Dispense Refill  . metoprolol succinate (TOPROL-XL) 50 MG 24 hr tablet Take 50 mg by mouth daily. Take with or immediately following a meal.      . [DISCONTINUED] amLODipine-benazepril (LOTREL) 10-20 MG per capsule Take 1 capsule by mouth daily.       No current facility-administered medications on file prior to visit.   PAST SURGICAL HISTORY No past surgical history on file.  FAMILY HISTORY: No family history on file.  SOCIAL HISTORY:  reports that he quit smoking about 35 years ago. His smoking use included Cigarettes. He started smoking about 50 years ago. He has a 7.5 pack-year smoking history. He quit smokeless tobacco use about 35 years ago. His smokeless tobacco use included Chew. He reports that he drinks alcohol. He reports that he does not use illicit drugs.  PERFORMANCE STATUS: The patient's performance status is 0 - Asymptomatic  PHYSICAL EXAM: Most Recent Vital Signs: Blood pressure 145/83, pulse 52, temperature 98 F (36.7 C), temperature source Oral, resp. rate 20, height 5\' 8"  (1.727 m), weight 222 lb (100.699 kg). BP 145/83  Pulse 52  Temp(Src) 98 F (36.7 C) (Oral)  Resp 20  Ht 5\' 8"  (1.727 m)  Wt 222 lb (100.699 kg)  BMI 33.76 kg/m2  General  Appearance:    Alert, cooperative, no distress, appears stated age  Head:    Normocephalic, without obvious abnormality, atraumatic  Eyes:    PERRL, conjunctiva/corneas clear, EOM's intact, fundi    benign, both eyes             Throat:   Lips, mucosa, and tongue normal; teeth and gums normal  Neck:   Supple, symmetrical, trachea midline, no adenopathy;       thyroid:  No enlargement/tenderness/nodules; no carotid   bruit or JVD  Back:     Symmetric, no curvature, ROM normal, no CVA tenderness  Lungs:     Clear to auscultation bilaterally, respirations unlabored  Chest wall:    No tenderness or deformity  Heart:     Regular rate and rhythm, S1 and S2 normal, no murmur, rub   or gallop  Abdomen:     Soft, non-tender, bowel sounds active all four quadrants,    no masses, no organomegaly        Extremities:   Extremities normal, atraumatic, no cyanosis or edema  Pulses:   2+ and symmetric all extremities  Skin:   Skin color, texture, turgor normal, no rashes or lesions  Lymph nodes:   Cervical, supraclavicular, and axillary nodes normal  Neurologic:   CNII-XII intact. Normal strength, sensation and reflexes      throughout   LABORATORY DATA:  Results for orders placed in visit on 01/07/14 (from the past 48 hour(s))  CBC WITH DIFFERENTIAL ([facility])     Status: Abnormal   Collection Time    01/07/14 10:56 AM      Result Value Ref Range   WBC 8.1  4.0 - 10.0 10e3/uL   RBC 3.83 (*) 4.20 - 5.70 10e6/uL   HGB 12.9 (*) 13.0 - 17.1 g/dL   HCT 16.138.1 (*) 09.638.7 - 04.549.9 %   MCV 100 (*) 82 - 98 fL   MCH 33.7 (*) 28.0 - 33.4 pg   MCHC 33.9  32.0 - 35.9 g/dL   RDW 40.913.0  81.111.1 - 91.415.7 %   Platelets 156  145 - 400 10e3/uL   NEUT# 4.8  1.5 - 6.5 10e3/uL   LYMPH# 2.6  0.9 - 3.3 10e3/uL   MONO# 0.5  0.1 - 0.9 10e3/uL   Eosinophils Absolute 0.2  0.0 - 0.5 10e3/uL   BASO# 0.0  0.0 - 0.2 10e3/uL   NEUT% 59.3  40.0 - 80.0 %   LYMPH% 31.6  14.0 - 48.0 %   MONO% 6.1  0.0 - 13.0 %   EOS% 2.5  0.0 - 7.0 %   BASO% 0.5  0.0 - 2.0 %  [facility] - SMEAR     Status: None   Collection Time    01/07/14 10:56 AM      Result Value Ref Range   Smear Result Smear Available       RADIOGRAPHY: No results found.     PATHOLOGY:  None  ASSESSMENT/PLAN: Jay Wolfe is a very pleasant 68 yo male with an elevated MCV for the last 9 months. Today his MCV is 100. He is completely asymptomatic.  He stopped drinking 2 months ago and his MCV has since decreased almost to normal. It is highly probable that these two are related.  We will start him on some Folic Acid and Thiamine. These should help him. Prescriptions were sent  to the pharmacy.  We will release him back to be followed by his PCP. However, he knows he  is welcome to come back and see [place] at any point in the future for a hem/onc related issue.  All questions were answered. The patient knows to call the clinic with any problems, questions or concerns. The patient discussed with and also seen by Dr. Myna Hidalgo and he is in agreement with the aforementioned.   [facility]    Addendum by Dr. Myna Hidalgo:   ADDENDUM:  I saw and examined the patient with Sarah. There looked at his blood smear. He had normochromic and normocytic red blood cells. There maybe some slight increase in size of his red cells. There were no spherocytes. He had no low formation. There is no nucleated red cells. I saw no teardrop cells. He had quite a few stomatocytes. White cells. Normal in morphology maturation. There were no hypersegmented polys. I saw no immature myeloid cells. There were no atypical lymphocytes. Platelets were adequate in number and size.  His exam was very much unremarkable. There is no lymphadenopathy. No palpable liver or spleen.  I just have to wonder about the alcohol intake. We often see an increase in stomatocytes with heavy alcohol use. He says he stopped drinking 2 months ago. His MCV has come down a little bit.  He really is not anemic. As such, I don't see anything that would suggest a actual bone marrow disorder. I don't see anything that looks like myelodysplasia. If he has had significant alcohol intake, there was could be some marrow poisoning by alcohol.  I don't see anything that looks like pernicious anemia.  He clearly is not iron overloaded. His ferritin is incredibly high but yet his iron saturation is only 21%. Again I think this could reflect liver injury from alcohol use. He may have a fatty liver.  I think he probably does need to be on folic acid and thiamine.  I really don't think that we have to get him back to be seen in our office.  Again, I just don't see any obvious hematologic disorder that we would have to followup on. I think that if he stops drinking, then his MCV should normalize a little bit.  We spent about 40-45 minutes with him. We answered his questions.

## 2014-01-12 LAB — RETICULOCYTES (CHCC)
ABS Retic: 92.6 10*3/uL (ref 19.0–186.0)
RBC.: 3.86 MIL/uL — ABNORMAL LOW (ref 4.22–5.81)
Retic Ct Pct: 2.4 % — ABNORMAL HIGH (ref 0.4–2.3)

## 2014-01-12 LAB — HEMOCHROMATOSIS DNA-PCR(C282Y,H63D)

## 2014-03-25 IMAGING — CT CT HEAD W/O CM
3 of 5 series · 14 of 47 positions shown, 16 images · non-contrast
Comparison: 12/02/2008

CLINICAL DATA: Fall with injury to the back of the head. Amnestic
to event.

EXAM:
CT HEAD WITHOUT CONTRAST
CT CERVICAL SPINE WITHOUT CONTRAST
TECHNIQUE: Multidetector CT imaging of the head and cervical spine was
performed following the standard protocol without intravenous
contrast. Multiplanar CT image reconstructions of the cervical spine
were also generated.

[Series 7: coronals · coronal · 0.38mm/px · 3 of 72 slices shown]
[im 24/72  brain]
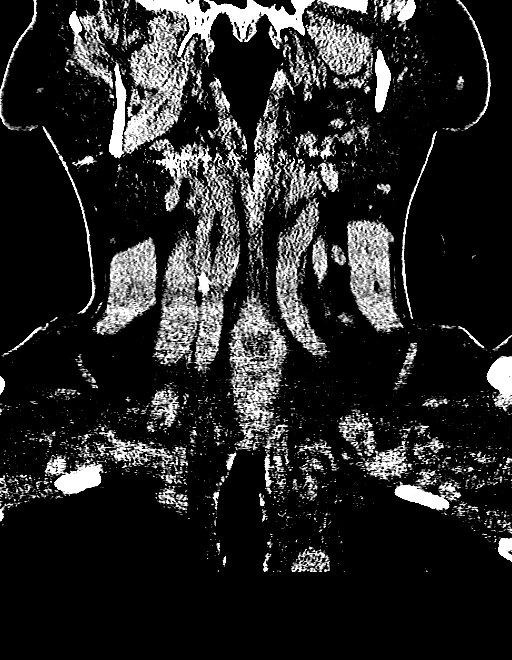
[im 32/72  brain]
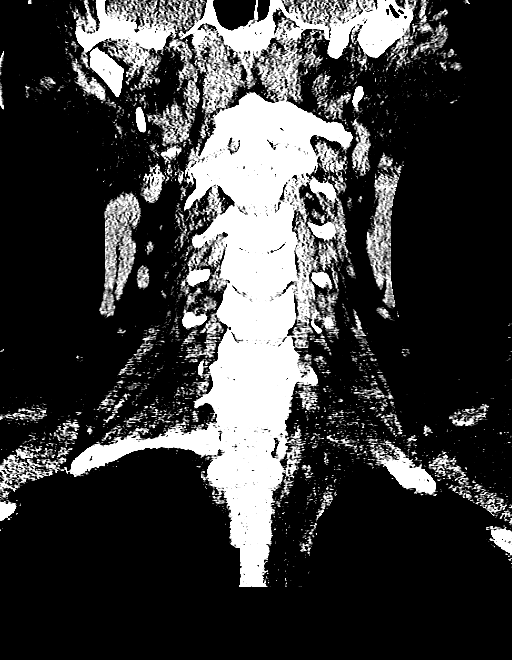
[im 40/72  brain]
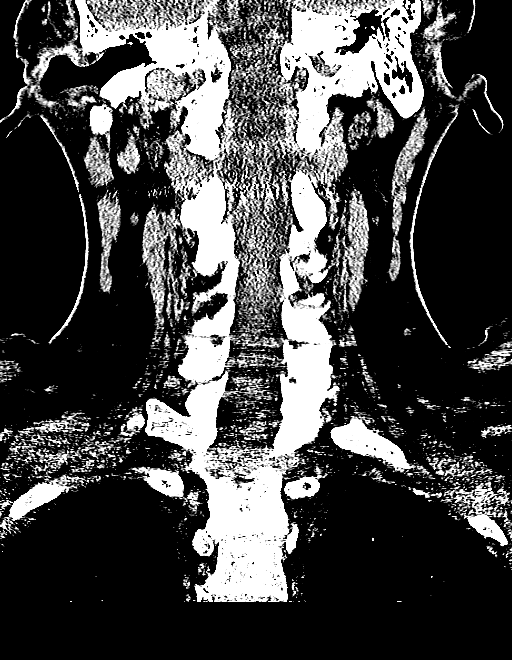

[Series 8: sagittals · sagittal · 0.38mm/px · 3 of 44 slices shown]
[im 15/44  brain]
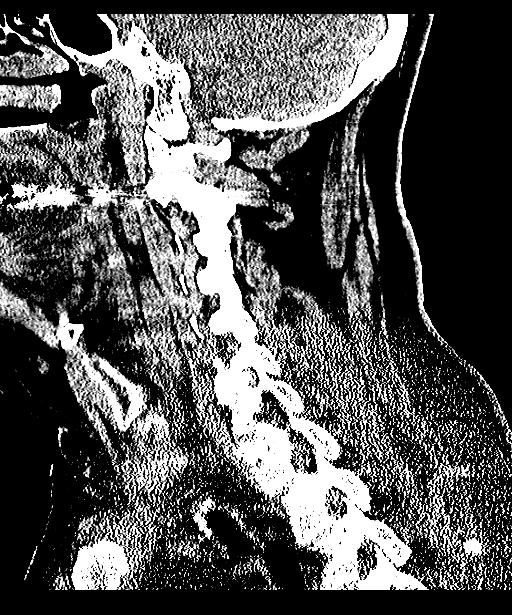
[im 22/44  brain]
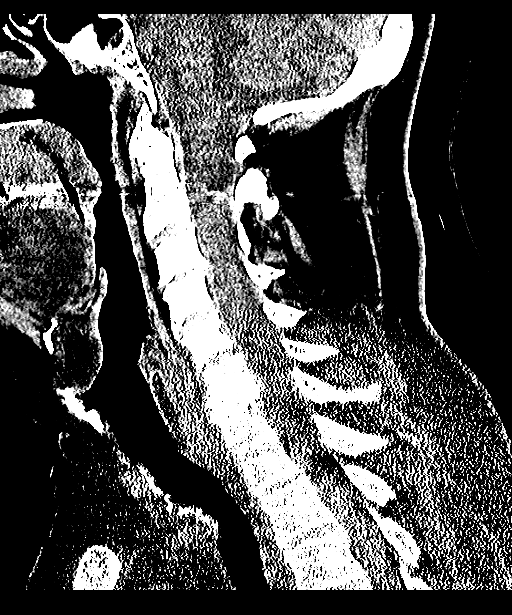
[im 29/44  brain]
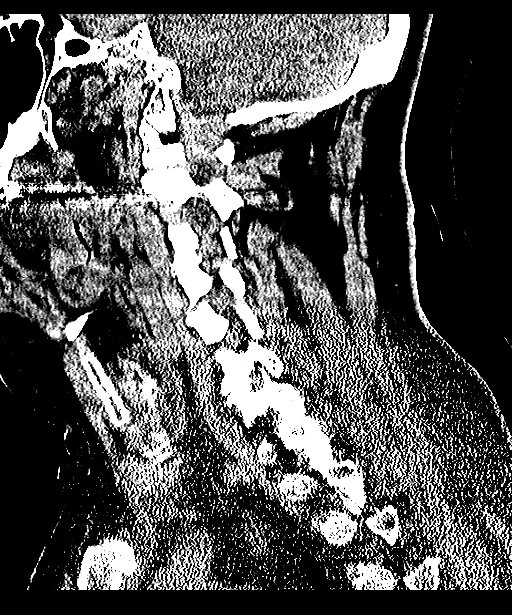

[Series 9: orthogonals · axial · 0.27mm/px · z∈[-386,-188]mm · 8 of 125 slices shown, 10 images]
[im 10/125  brain]
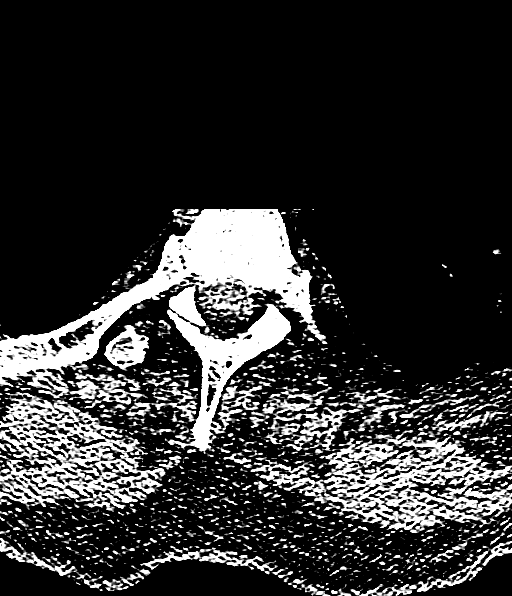
[im 10/125  bone]
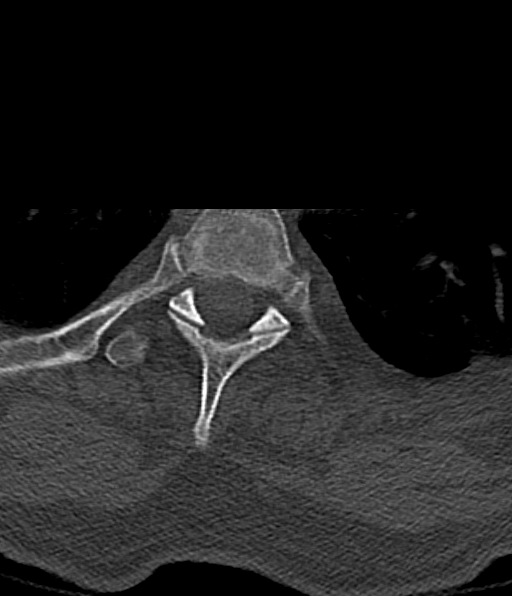
[im 29/125  brain]
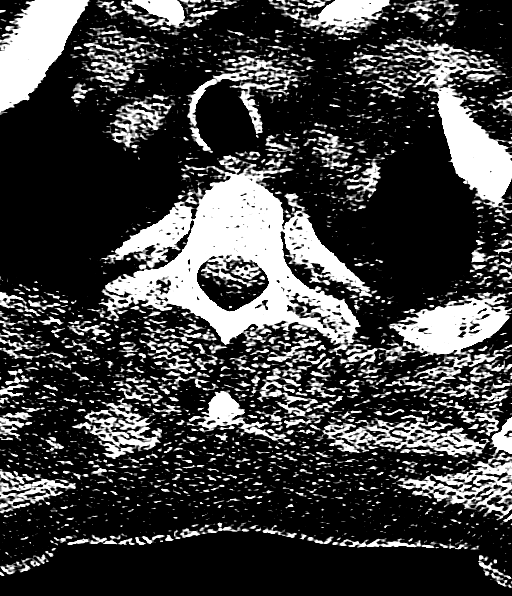
[im 39/125  brain]
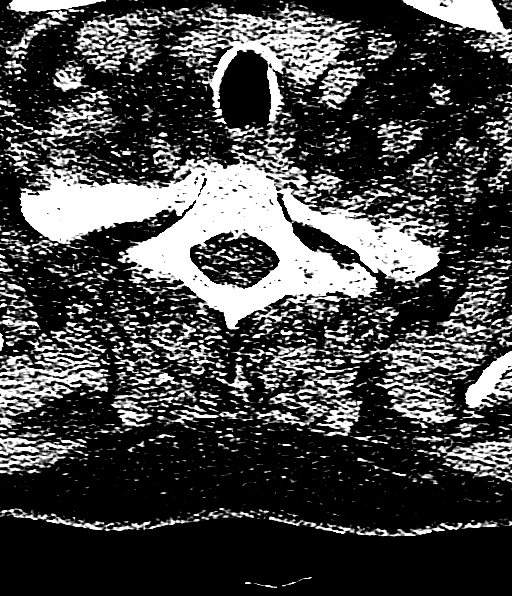
[im 58/125  brain]
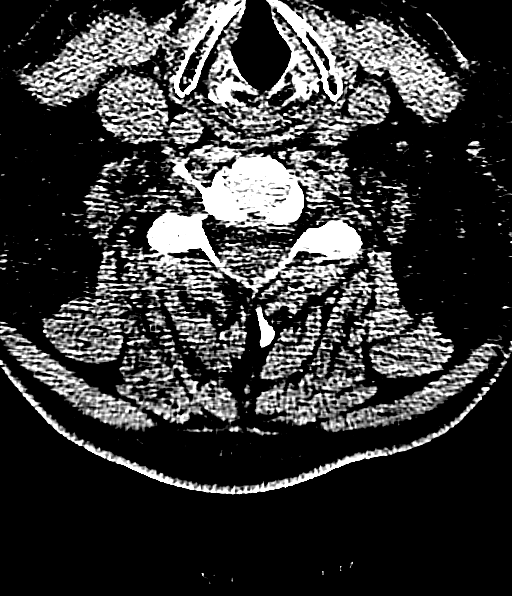
[im 67/125  brain]
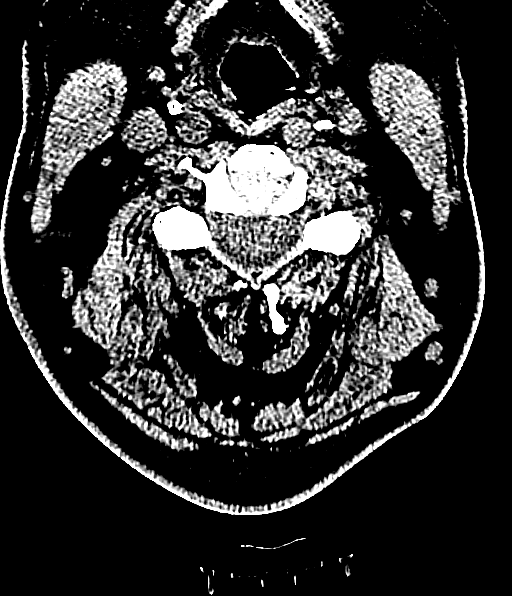
[im 67/125  bone]
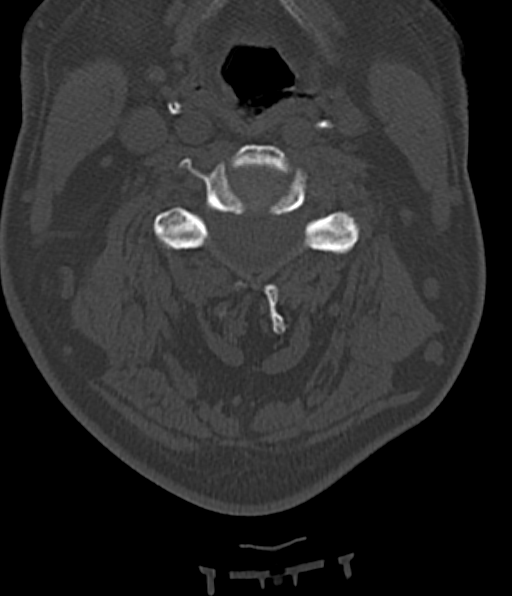
[im 86/125  brain]
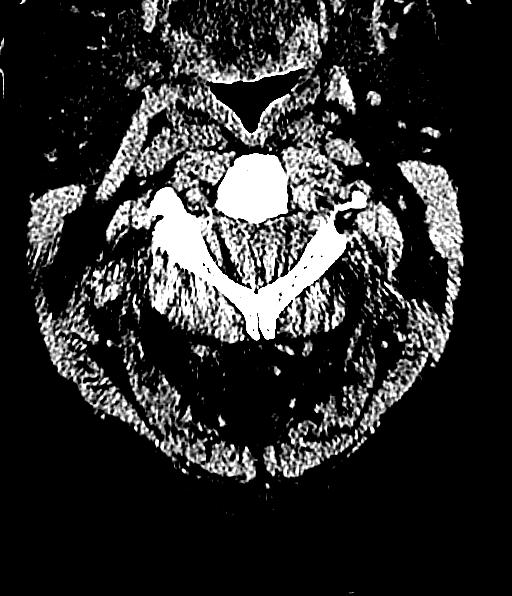
[im 96/125  brain]
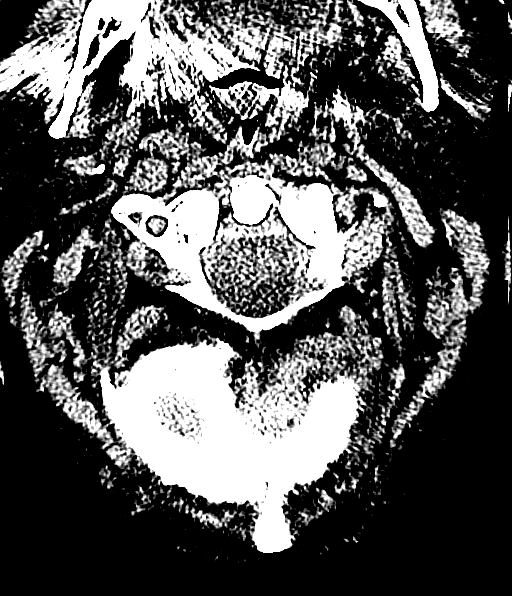
[im 115/125  brain]
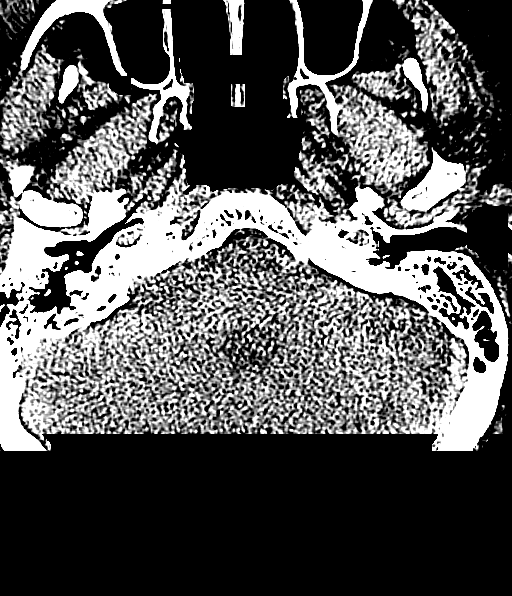

[14 of 47 positions shown; findings below may reference images not displayed]

FINDINGS: CT HEAD FINDINGS

Left parietal scalp hematoma. The brainstem, cerebellum, cerebral
peduncles, thalamus, basal ganglia, basilar cisterns, and
ventricular system appear within normal limits. No intracranial
hemorrhage, mass lesion, or acute CVA. Chronic right maxillary and
right sphenoid sinusitis.

CT CERVICAL SPINE FINDINGS

Cervical spondylosis and degenerative disk disease noted with loss
of disc height at C3-4 and C6-7, and uncinate spurring causing right
foraminal stenosis at C6-7. No fracture or significant malalignment.
Advanced degenerative findings at the anterior C1-2 articulation.
IMPRESSION: CT HEAD IMPRESSION

1. Left parietal scalp hematoma. No acute intracranial findings.
2. Chronic right maxillary and right sphenoid sinusitis.

CT CERVICAL SPINE IMPRESSION

1. Cervical spondylosis and degenerative disc disease, causing right
foraminal stenosis at C6-7. No fracture or acute subluxation in the
cervical spine identified.

## 2015-04-14 ENCOUNTER — Emergency Department (HOSPITAL_BASED_OUTPATIENT_CLINIC_OR_DEPARTMENT_OTHER): Payer: Medicare Other

## 2015-04-14 ENCOUNTER — Emergency Department (HOSPITAL_BASED_OUTPATIENT_CLINIC_OR_DEPARTMENT_OTHER)
Admission: EM | Admit: 2015-04-14 | Discharge: 2015-04-14 | Disposition: A | Discharge status: Home / Self Care | Payer: Medicare Other | Attending: Emergency Medicine | Admitting: Emergency Medicine

## 2015-04-14 ENCOUNTER — Encounter (HOSPITAL_BASED_OUTPATIENT_CLINIC_OR_DEPARTMENT_OTHER): Payer: Self-pay | Admitting: *Deleted

## 2015-04-14 DIAGNOSIS — I10 Essential (primary) hypertension: Secondary | ICD-10-CM | POA: Diagnosis not present

## 2015-04-14 DIAGNOSIS — W01198A Fall on same level from slipping, tripping and stumbling with subsequent striking against other object, initial encounter: Secondary | ICD-10-CM | POA: Diagnosis not present

## 2015-04-14 DIAGNOSIS — Y9301 Activity, walking, marching and hiking: Secondary | ICD-10-CM | POA: Insufficient documentation

## 2015-04-14 DIAGNOSIS — Z7982 Long term (current) use of aspirin: Secondary | ICD-10-CM | POA: Diagnosis not present

## 2015-04-14 DIAGNOSIS — Z8639 Personal history of other endocrine, nutritional and metabolic disease: Secondary | ICD-10-CM | POA: Diagnosis not present

## 2015-04-14 DIAGNOSIS — S20212A Contusion of left front wall of thorax, initial encounter: Secondary | ICD-10-CM | POA: Diagnosis not present

## 2015-04-14 DIAGNOSIS — Z8659 Personal history of other mental and behavioral disorders: Secondary | ICD-10-CM | POA: Diagnosis not present

## 2015-04-14 DIAGNOSIS — Z79899 Other long term (current) drug therapy: Secondary | ICD-10-CM | POA: Insufficient documentation

## 2015-04-14 DIAGNOSIS — S299XXA Unspecified injury of thorax, initial encounter: Secondary | ICD-10-CM | POA: Diagnosis present

## 2015-04-14 DIAGNOSIS — R0789 Other chest pain: Secondary | ICD-10-CM

## 2015-04-14 DIAGNOSIS — Y998 Other external cause status: Secondary | ICD-10-CM | POA: Insufficient documentation

## 2015-04-14 DIAGNOSIS — Y9289 Other specified places as the place of occurrence of the external cause: Secondary | ICD-10-CM | POA: Diagnosis not present

## 2015-04-14 DIAGNOSIS — Z87891 Personal history of nicotine dependence: Secondary | ICD-10-CM | POA: Insufficient documentation

## 2015-04-14 DIAGNOSIS — W19XXXA Unspecified fall, initial encounter: Secondary | ICD-10-CM

## 2015-04-14 HISTORY — DX: Unspecified asthma, uncomplicated: J45.909

## 2015-04-14 MED ORDER — HYDROCODONE-ACETAMINOPHEN 5-325 MG PO TABS: 1.0000 | ORAL_TABLET | Freq: Four times a day (QID) | ORAL | Status: AC | PRN

## 2015-04-14 NOTE — ED Notes (Signed)
Pt reports fell 2 days ago and landed on metal stacking hitting chest- bruising noted- pt reports hit chin also and c/o neck pain and difficulty eating b/c hard to swallow- denies LOC- other bruises to arms and legs- +nausea

## 2015-04-14 NOTE — ED Provider Notes (Signed)
CSN: [id number]     Arrival date & time 04/14/15  0907 History   First MD Initiated Contact with Patient 04/14/15 [phone]     Chief Complaint  Patient presents with  . Chest Injury  . Fall     (Consider location/radiation/quality/duration/timing/severity/associated sxs/prior Treatment) HPI Comments: Lilla ShookJames L Degnan is a 69 y.o. male with a PMHx of HTN, HLD, and alcoholism, who presents to the ED with complaints of chest wall pain due to a fall sustained 2 days ago. Patient states he was walking and [employer], and fell onto a metal stacking device directly onto his chest. He reports the pain is 8/10 with touching, but 0/10 at this time, anterior chest wall pain, intermittent with touch or coughing, sore in nature, worse with coughing and touching the area, and with intravenous tried prior to arrival. He states this morning he had some nausea which has resolved. Associated symptoms include bruising to the chest wall as well as a few small bruises to his arms and legs, but states that this does not concern him. He states originally he had some neck pain which is resolved, but endorses that eating causes him some discomfort in his throat/chest. He denies any head injury or loss of consciousness, fevers, chills, shortness breath, abdominal pain, vomiting, diarrhea, dysuria, hematuria, numbness, tingling, weakness, diaphoresis, or lightheadedness. He denies being on blood thinners.  Patient is a 69 y.o. male presenting with chest pain. The history is provided by the patient. No language interpreter was used.  Chest Pain Pain location:  L chest and substernal area Pain quality comment:  Sore Pain radiates to:  Does not radiate Pain radiates to the back: no   Pain severity:  Moderate Onset quality:  Gradual Duration:  2 days Timing:  Intermittent Progression:  Unchanged Chronicity:  New Context: trauma   Context comment:  Fall Relieved by:  None tried Exacerbated by: touching or  coughing. Ineffective treatments:  None tried Associated symptoms: no abdominal pain, no back pain, no cough, no diaphoresis, no fever, no lower extremity edema, no nausea (none ongoing), no numbness, no shortness of breath, not vomiting and no weakness   Risk factors: high cholesterol, hypertension and male sex   Risk factors: no smoking     Past Medical History  Diagnosis Date  . Alcoholic   . Hypertension   . High cholesterol    No past surgical history on file. No family history on file. Social History  Substance Use Topics  . Smoking status: Former Smoker -- 0.50 packs/day for 15 years    Types: Cigarettes    Start date: 08/08/1963    Quit date: 01/08/1979  . Smokeless tobacco: Former NeurosurgeonUser    Types: Chew    Quit date: 01/08/1979     Comment: quit 35 years ago  . Alcohol Use: Yes    Review of Systems  Constitutional: Negative for fever, chills and diaphoresis.  HENT: Negative for facial swelling (no head inj).   Respiratory: Negative for cough and shortness of breath.   Cardiovascular: Positive for chest pain (chest wall).  Gastrointestinal: Negative for nausea (none ongoing), vomiting, abdominal pain and diarrhea.  Genitourinary: Negative for dysuria and hematuria.  Musculoskeletal: Negative for myalgias, back pain, joint swelling, arthralgias and neck pain.  Skin: Positive for color change (bruising).  Allergic/Immunologic: Negative for immunocompromised state.  Neurological: Negative for syncope, weakness, light-headedness and numbness.  Psychiatric/Behavioral: Negative for confusion.   10 Systems reviewed and are negative for acute change except as  noted in the HPI.    Allergies  Sulfa antibiotics; Benazepril; and Simvastatin  Home Medications   Prior to Admission medications   Medication Sig Start Date End Date Taking? Authorizing Provider  Aspirin (ASPIR-81 PO) Take by mouth every morning.    Historical Provider, MD  folic acid (FOLVITE) 1 MG tablet Take  2 tablets (2 mg total) by mouth daily. 01/07/14   Verdie Mosher, NP  hydrALAZINE (APRESOLINE) 25 MG tablet Take 25 mg by mouth 2 (two) times daily.    Historical Provider, MD  metoprolol succinate (TOPROL-XL) 50 MG 24 hr tablet Take 50 mg by mouth daily. Take with or immediately following a meal.    Historical Provider, MD  spironolactone (ALDACTONE) 50 MG tablet Take 50 mg by mouth daily. TAKES 1/2 TAB IN  AM    Historical Provider, MD  thiamine (VITAMIN B-1) 100 MG tablet Take 1 tablet (100 mg total) by mouth daily. 01/07/14   Verdie Mosher, NP   BP 168/90 mmHg  Pulse 78  Temp(Src) 97.7 F (36.5 C) (Oral)  Resp 18  Ht  (1.727 m)  Wt 220 lb (99.791 kg)  BMI 33.46 kg/m2  SpO2 98% Physical Exam  Constitutional: He is oriented to person, place, and time. Vital signs are normal. He appears well-developed and well-nourished.  Non-toxic appearance. No distress.  Afebrile, nontoxic, NAD  HENT:  Head: Normocephalic and atraumatic.  Mouth/Throat: Oropharynx is clear and moist and mucous membranes are normal.  Eyes: Conjunctivae and EOM are normal. Right eye exhibits no discharge. Left eye exhibits no discharge.  Neck: Normal range of motion. Neck supple. No spinous process tenderness and no muscular tenderness present. No rigidity. Normal range of motion present.  FROM intact without spinous process TTP, no bony stepoffs or deformities, no paraspinous muscle TTP or muscle spasms. No rigidity or meningeal signs. No bruising or swelling.   Cardiovascular: Normal rate, regular rhythm, normal heart sounds and intact distal pulses.  Exam reveals no gallop and no friction rub.   No murmur heard. RRR, nl s1/s2, no m/r/g, distal pulses intact, no pedal edema   Pulmonary/Chest: Effort normal and breath sounds normal. No respiratory distress. He has no decreased breath sounds. He has no wheezes. He has no rhonchi. He has no rales. He exhibits tenderness. He exhibits no crepitus, no deformity  and no retraction.    CTAB in all lung fields, no w/r/r, no hypoxia or increased WOB, speaking in full sentences, SpO2 98% on RA  Chest wall with TTP over sternum and L anterior chest, bruising noted to L breast area, no crepitus or retraction, no deformities  Abdominal: Soft. Normal appearance and bowel sounds are normal. He exhibits no distension. There is no tenderness. There is no rigidity, no rebound, no guarding, no CVA tenderness, no tenderness at McBurney's point and negative Murphy's sign.  Musculoskeletal: Normal range of motion.  MAE x4 Strength and sensation grossly intact Distal pulses intact No pedal edema Gait steady  Neurological: He is alert and oriented to person, place, and time. He has normal strength. No sensory deficit.  Skin: Skin is warm, dry and intact. Bruising noted. No rash noted.  Bruising to R hand and chest  Psychiatric: He has a normal mood and affect.  Nursing note and vitals reviewed.   ED Course  Procedures (including critical care time) Labs Review Labs Reviewed - No data to display  Imaging Review Dg Chest 2 View  04/14/2015  CLINICAL DATA:  Wheezing EXAM:  CHEST  2 VIEW COMPARISON:  11/12/2013 FINDINGS: Cardiomediastinal silhouette is stable. No acute infiltrate or pleural effusion. No pulmonary edema. Bony thorax is unremarkable. IMPRESSION: No active cardiopulmonary disease. Electronically Signed   By: Natasha Mead M.D.   On: 04/14/2015 10:00   I have personally reviewed and evaluated these images and lab results as part of my medical decision-making.   EKG Interpretation   Date/Time:  Monday April 14 2015 09:23:06 EST Ventricular Rate:  72 PR Interval:  190 QRS Duration: 88 QT Interval:  390 QTC Calculation: 427 R Axis:   -56 Text Interpretation:  Normal sinus rhythm Left axis deviation Abnormal ECG  No significant change since last tracing Confirmed by LIU MD, DANA ([id number])  on 04/14/2015 9:49:49 AM      MDM   Final diagnoses:   Chest wall pain  Fall, initial encounter  Superficial bruising of chest wall, left, initial encounter    70 y.o. male here with chest wall pain after a fall 2 days ago. Bruising noted to anterior chest wall. Stated he had some neck soreness yesterday but none today. States it's somewhat hard to swallow due to pain. Had nausea this morning but none now. On exam, tenderness and bruising noted to anterior chest. Will get EKG and CXR. Doubt need for labs. Will reassess shortly.   10:24 AM CXR clear. EKG unremarkable. Will treat as if it were a possible rib fx, send home with pain meds, discussed ice/heat use, and give incentive spirometer. Will have him f/up with PCP. I explained the diagnosis and have given explicit precautions to return to the ER including for any other new or worsening symptoms. The patient understands and accepts the medical plan as it's been dictated and I have answered their questions. Discharge instructions concerning home care and prescriptions have been given. The patient is STABLE and is discharged to home in good condition.  BP 168/90 mmHg  Pulse 78  Temp(Src) 97.7 F (36.5 C) (Oral)  Resp 18  Ht  (1.727 m)  Wt 220 lb (99.791 kg)  BMI 33.46 kg/m2  SpO2 98%  Meds ordered this encounter  Medications  . HYDROcodone-acetaminophen (NORCO) 5-325 MG tablet    Sig: Take 1-2 tablets by mouth every 6 (six) hours as needed for severe pain.    Dispense:  6 tablet    Refill:  0    Order Specific Question:  Supervising Provider    Answer:  Evlyn Kanner, PA-C 04/14/15 1024  Lavera Guise, MD 04/14/15 2020

## 2015-04-14 NOTE — ED Notes (Signed)
Patient transported to X-ray 

## 2015-04-14 NOTE — Discharge Instructions (Signed)
Use motrin or norco as needed for pain, but don't drive while taking norco. Use the incentive spirometer as directed, at least once every hour while awake, to encourage good lung expansion. Stay well hydrated. Use ice or heat to the areas of soreness, no more than 20 minutes every hour. Follow up with your regular doctor in 3-5 days for recheck of ongoing symptoms. Return to the ER for changes or worsening symptoms.   Chest Contusion A contusion is a deep bruise. Bruises happen when an injury causes bleeding under the skin. Signs of bruising include pain, puffiness (swelling), and discolored skin. The bruise may turn blue, purple, or yellow.  HOME CARE  Put ice on the injured area.  Put ice in a plastic bag.  Place a towel between the skin and the bag.  Leave the ice on for 15-20 minutes at a time, 03-04 times a day for the first 48 hours.  Only take medicine as told by your doctor.  Rest.  Take deep breaths (deep-breathing exercises) as told by your doctor.  Stop smoking if you smoke.  Do not lift objects over 5 pounds (2.3 kilograms) for 3 days or longer if told by your doctor. GET HELP RIGHT AWAY IF:   You have more bruising or puffiness.  You have pain that gets worse.  You have trouble breathing.  You are dizzy, weak, or pass out (faint).  You have blood in your pee (urine) or poop (stool).  You cough up or throw up (vomit) blood.  Your puffiness or pain is not helped with medicines. MAKE SURE YOU:   Understand these instructions.  Will watch your condition.  Will get help right away if you are not doing well or get worse.   This information is not intended to replace advice given to you by your health care provider. Make sure you discuss any questions you have with your health care provider.   Document Released: 10/27/2007 Document Revised: 02/02/2012 Document Reviewed: 11/01/2011 Elsevier Interactive Patient Education 2016 Elsevier Inc.  Chest Wall  Pain Chest wall pain is pain in or around the bones and muscles of your chest. Sometimes, an injury causes this pain. Sometimes, the cause may not be known. This pain may take several weeks or longer to get better. HOME CARE Pay attention to any changes in your symptoms. Take these actions to help with your pain:  Rest as told by your doctor.  Avoid activities that cause pain. Try not to use your chest, belly (abdominal), or side muscles to lift heavy things.  If directed, apply ice to the painful area:  Put ice in a plastic bag.  Place a towel between your skin and the bag.  Leave the ice on for 20 minutes, 2-3 times per day.  Take over-the-counter and prescription medicines only as told by your doctor.  Do not use tobacco products, including cigarettes, chewing tobacco, and e-cigarettes. If you need help quitting, ask your doctor.  Keep all follow-up visits as told by your doctor. This is important. GET HELP IF:  You have a fever.  Your chest pain gets worse.  You have new symptoms. GET HELP RIGHT AWAY IF:  You feel sick to your stomach (nauseous) or you throw up (vomit).  You feel sweaty or light-headed.  You have a cough with phlegm (sputum) or you cough up blood.  You are short of breath.   This information is not intended to replace advice given to you by your health care  provider. Make sure you discuss any questions you have with your health care provider.   Document Released: 10/27/2007 Document Revised: 01/29/2015 Document Reviewed: 08/05/2014 Elsevier Interactive Patient Education 2016 Elsevier Inc.  Cryotherapy Cryotherapy is when you put ice on your injury. Ice helps lessen pain and puffiness (swelling) after an injury. Ice works the best when you start using it in the first 24 to 48 hours after an injury. HOME CARE  Put a dry or damp towel between the ice pack and your skin.  You may press gently on the ice pack.  Leave the ice on for no more than 10  to 20 minutes at a time.  Check your skin after 5 minutes to make sure your skin is okay.  Rest at least 20 minutes between ice pack uses.  Stop using ice when your skin loses feeling (numbness).  Do not use ice on someone who cannot tell you when it hurts. This includes small children and people with memory problems (dementia). GET HELP RIGHT AWAY IF:  You have white spots on your skin.  Your skin turns blue or pale.  Your skin feels waxy or hard.  Your puffiness gets worse. MAKE SURE YOU:   Understand these instructions.  Will watch your condition.  Will get help right away if you are not doing well or get worse.   This information is not intended to replace advice given to you by your health care provider. Make sure you discuss any questions you have with your health care provider.   Document Released: 10/27/2007 Document Revised: 08/02/2011 Document Reviewed: 12/31/2010 Elsevier Interactive Patient Education [employer] Elsevier Inc.

## 2015-04-14 NOTE — ED Notes (Signed)
PA at bedside.

## 2015-04-14 NOTE — ED Provider Notes (Signed)
Medical screening examination/treatment/procedure(s) were conducted as a shared visit with non-physician practitioner(s) and myself.  I personally evaluated the patient during the encounter.   EKG Interpretation   Date/Time:  Monday April 14 2015 09:23:06 EST Ventricular Rate:  72 PR Interval:  190 QRS Duration: 88 QT Interval:  390 QTC Calculation: 427 R Axis:   -56 Text Interpretation:  Normal sinus rhythm Left axis deviation Abnormal ECG  No significant change since last tracing Confirmed by Dandy Lazaro MD, Lavontay Kirk [id number])  on 04/14/2015 9:49:3849 AM      69 year old male with history of hypertension and hyperlipidemia who presents after mechanical fall. Is not anticoagulated. Tripped over onto a metal stacking device into his chest wall. Complains of bruising and pain with palpation to his anterior chest wall. No significant pain with inspiration or breathing. He is well-appearing and in no acute distress on presentation. Breathing comfortably on room air. Vital signs are non-concerning. On exam has bruising noted to the left anterior chest wall over his pec muscles. Some mild tenderness also noted along the sternum. Lungs are clear to auscultation bilaterally. EKG is not concerning, and chest x-ray shows no major injuries or other acute cardiopulmonary processes. At this time, given that pain is well controlled, I do not suspect that he sustained a serious intrathoracic injuries  Requiring CT imaging. We will clinically treat him as possible contused or broken ribs with pain medication and incentive spirometry. Discussed warning signs of pneumonia, and discussed strict return instructions. He will follow-up this week with his nurse practitioner for reevaluation.  Lavera Guiseana Duo Segundo Makela, MD 04/14/15 [phone]

## 2015-05-13 ENCOUNTER — Encounter (HOSPITAL_COMMUNITY): Payer: Self-pay | Admitting: *Deleted

## 2015-05-13 ENCOUNTER — Emergency Department (HOSPITAL_COMMUNITY)
Admission: EM | Admit: 2015-05-13 | Discharge: 2015-05-13 | Disposition: A | Discharge status: Home / Self Care | Payer: Medicare Other | Attending: Emergency Medicine | Admitting: Emergency Medicine

## 2015-05-13 DIAGNOSIS — F1029 Alcohol dependence with unspecified alcohol-induced disorder: Secondary | ICD-10-CM | POA: Diagnosis not present

## 2015-05-13 DIAGNOSIS — Z8639 Personal history of other endocrine, nutritional and metabolic disease: Secondary | ICD-10-CM | POA: Insufficient documentation

## 2015-05-13 DIAGNOSIS — R251 Tremor, unspecified: Secondary | ICD-10-CM | POA: Diagnosis present

## 2015-05-13 DIAGNOSIS — R441 Visual hallucinations: Secondary | ICD-10-CM | POA: Diagnosis not present

## 2015-05-13 DIAGNOSIS — Z79899 Other long term (current) drug therapy: Secondary | ICD-10-CM | POA: Diagnosis not present

## 2015-05-13 DIAGNOSIS — I10 Essential (primary) hypertension: Secondary | ICD-10-CM | POA: Insufficient documentation

## 2015-05-13 DIAGNOSIS — Z87891 Personal history of nicotine dependence: Secondary | ICD-10-CM | POA: Insufficient documentation

## 2015-05-13 DIAGNOSIS — J45909 Unspecified asthma, uncomplicated: Secondary | ICD-10-CM | POA: Insufficient documentation

## 2015-05-13 DIAGNOSIS — Z7982 Long term (current) use of aspirin: Secondary | ICD-10-CM | POA: Insufficient documentation

## 2015-05-13 DIAGNOSIS — Z7951 Long term (current) use of inhaled steroids: Secondary | ICD-10-CM | POA: Insufficient documentation

## 2015-05-13 MED ORDER — CHLORDIAZEPOXIDE HCL 25 MG PO CAPS
50.0000 mg | ORAL_CAPSULE | Freq: Once | ORAL | Status: AC
  Administered 2015-05-13: 50 mg via ORAL
  Filled 2015-05-13: qty 2

## 2015-05-13 MED ORDER — VITAMIN B-1 100 MG PO TABS
100.0000 mg | ORAL_TABLET | Freq: Once | ORAL | Status: AC
  Administered 2015-05-13: 100 mg via ORAL
  Filled 2015-05-13: qty 1

## 2015-05-13 MED ORDER — CHLORDIAZEPOXIDE HCL 25 MG PO CAPS: ORAL_CAPSULE | ORAL | Status: AC

## 2015-05-13 NOTE — ED Provider Notes (Signed)
CSN: [id number]     Arrival date & time 05/13/15  [id number] History   First MD Initiated Contact with Patient 05/13/15 [phone]     Chief Complaint  Patient presents with  . Delirium Tremens (DTS)     (Consider location/radiation/quality/duration/timing/severity/associated sxs/prior Treatment) Patient is a 69 y.o. male presenting with general illness. The history is provided by the patient.  Illness Severity:  Mild Onset quality:  Gradual Duration:  2 days Timing:  Constant Progression:  Worsening Chronicity:  New Associated symptoms: no abdominal pain, no chest pain, no congestion, no diarrhea, no fever, no headaches, no myalgias, no rash, no shortness of breath and no vomiting     69 yo M with a chief complaints of possible alcohol withdrawal. Patient stopped drinking a couple days ago. Patient does want to quit. [facility] Patient had some tremors which have resolved. Has never had outright withdrawal before. Denies seizures. This morning patient felt like he was watching TV after he turned the TV off. Also felt like he did see stuffed animals dancing around the house. Currently patient feels pretty well is having no active hallucinations.  Past Medical History  Diagnosis Date  . Alcoholic (HCC)   . Hypertension   . High cholesterol   . Asthma    Past Surgical History  Procedure Laterality Date  . Umbilical hernia repair    . Inguinal hernia repair     No family history on file. Social History  Substance Use Topics  . Smoking status: Former Smoker -- 0.50 packs/day for 15 years    Types: Cigarettes    Start date: 08/08/1963    Quit date: 01/08/1979  . Smokeless tobacco: Former NeurosurgeonUser    Types: Chew    Quit date: 01/08/1979     Comment: quit 35 years ago  . Alcohol Use: Yes     Comment: 3-6 beer/day    Review of Systems  Constitutional: Negative for fever and chills.  HENT: Negative for congestion and facial swelling.   Eyes:  Negative for discharge and visual disturbance.  Respiratory: Negative for shortness of breath.   Cardiovascular: Negative for chest pain and palpitations.  Gastrointestinal: Negative for vomiting, abdominal pain and diarrhea.  Musculoskeletal: Negative for myalgias and arthralgias.  Skin: Negative for color change and rash.  Neurological: Positive for tremors. Negative for syncope and headaches.  Psychiatric/Behavioral: Positive for hallucinations. Negative for confusion and dysphoric mood.      Allergies  Sulfa antibiotics; Benazepril; and Simvastatin  Home Medications   Prior to Admission medications   Medication Sig Start Date End Date Taking? Authorizing Provider  Aspirin (ASPIR-81 PO) Take by mouth every morning.    Historical Provider, MD  chlordiazePOXIDE (LIBRIUM) 25 MG capsule 50mg  PO TID x 1D, then 25-50mg  PO BID X 1D, then 25-50mg  PO QD X 1D 05/13/15   Melene Planan Lakeyshia Tuckerman, DO  fluticasone-salmeterol (ADVAIR HFA) 230-21 MCG/ACT inhaler Inhale 2 puffs into the lungs 2 (two) times daily.    Historical Provider, MD  folic acid (FOLVITE) 1 MG tablet Take 2 tablets (2 mg total) by mouth daily. 01/07/14   Verdie MosherSarah M [place], NP  hydrALAZINE (APRESOLINE) 25 MG tablet Take 25 mg by mouth 2 (two) times daily.    Historical Provider, MD  HYDROcodone-acetaminophen (NORCO) 5-325 MG tablet Take 1-2 tablets by mouth every 6 (six) hours as needed for severe pain. 04/14/15   Mercedes Camprubi-Soms, PA-C  metoprolol succinate (TOPROL-XL) 50 MG 24 hr tablet Take 50  mg by mouth daily. Take with or immediately following a meal.    Historical Provider, MD  spironolactone (ALDACTONE) 50 MG tablet Take 50 mg by mouth daily. TAKES 1/2 TAB IN  AM    Historical Provider, MD  thiamine (VITAMIN B-1) 100 MG tablet Take 1 tablet (100 mg total) by mouth daily. 01/07/14   Verdie Mosher, NP   BP 141/95 mmHg  Pulse 88  Temp(Src) 98.5 F (36.9 C) (Oral)  Resp 15  SpO2 100% Physical Exam  Constitutional: He is  oriented to person, place, and time. He appears well-developed and well-nourished.  HENT:  Head: Normocephalic and atraumatic.  Eyes: EOM are normal. Pupils are equal, round, and reactive to light.  Neck: Normal range of motion. Neck supple. No JVD present.  Cardiovascular: Normal rate and regular rhythm.  Exam reveals no gallop and no friction rub.   No murmur heard. Pulmonary/Chest: No respiratory distress. He has no wheezes.  Abdominal: He exhibits no distension. There is no rebound and no guarding.  Musculoskeletal: Normal range of motion. He exhibits no edema or tenderness.  Neurological: He is alert and oriented to person, place, and time.  Skin: No rash noted. No pallor.  Psychiatric: He has a normal mood and affect. His behavior is normal.  Nursing note and vitals reviewed.   ED Course  Procedures (including critical care time) Labs Review Labs Reviewed - No data to display  Imaging Review No results found. I have personally reviewed and evaluated these images and lab results as part of my medical decision-making.   EKG Interpretation None      MDM   Final diagnoses:  Hallucination, visual  Alcohol dependence with unspecified alcohol-induced disorder [facility])    69 yo M with a chief complaint of possible alcohol withdrawal. Patient is not hypertensive or tachycardic. Not having any current hallucinations. Discussed outpatient options with the patient. He will like to follow up in [place]. Suggested he go there today after discharge. Patient was given 50 mg of Librium. Given prescription for Librium taper.  9:26 AM:  I have discussed the diagnosis/risks/treatment options with the patient and believe the pt to be eligible for discharge home to follow-up with [facility]. We also discussed returning to the ED immediately if new or worsening sx occur. We discussed the sx which are most concerning (e.g., sudden worsening symptoms) that necessitate immediate return.  Medications administered to the patient during their visit and any new prescriptions provided to the patient are listed below.  Medications given during this visit Medications  thiamine (VITAMIN B-1) tablet 100 mg (not administered)  chlordiazePOXIDE (LIBRIUM) capsule 50 mg (not administered)    New Prescriptions   CHLORDIAZEPOXIDE (LIBRIUM) 25 MG CAPSULE     PO TID x 1D, then 25-50mg  PO BID X 1D, then 25-50mg  PO QD X 1D    The patient appears reasonably screen and/or stabilized for discharge and I doubt any other medical condition or other [facility] requiring further screening, evaluation, or treatment in the ED at this time prior to discharge.     Melene Plan, DO 05/13/15 [phone]

## 2015-05-13 NOTE — ED Notes (Signed)
Per EMS - patient comes from home where he lives with his wife with c/o withdrawal s/s from EtOH.  Patient's last use of EtOH was 2 days ago and he typically drinks daily.  Patient was hallucinating this morning so much so that he could not drive his wife to a doctor's appointment.  Patient's vitals, 160/110, HR 82, 18 RR.  Previous withdrawal history unknown.

## 2015-05-13 NOTE — ED Notes (Signed)
Bed: [id number] Expected date:  Expected time:  Means of arrival:  Comments: EMS- 69yo M, hallucinations d/t ETOH withdrawal

## 2015-05-13 NOTE — Discharge Instructions (Signed)
[place] in the [facility]  Intensive Outpatient Programs: [facility]      [place]. [place], [place] Both a day and evening program       [facility] Outpatient     [address]        [place], [place] [address] [phone]         ADS: Alcohol & [facility] S[facility] [place]: [phone] or [phone] [address], [place] [address] [email or web address]   Substance Abuse Resources: - Alcohol and Drug Services  [phone] - [facility] [phone] - The [place] [phone] - Residential & Outpatient Substance Abuse Program  [phone]  Psychological Services: - [place]  [employer]  [place], [phone] [place]. [address] [place], [place]: [phone] or [phone], [email or web address]  Mobile Crisis Teams:                                        [facility]         Mobile Crisis Care Unit [phone]             [facility] [place] [address] Lady [place] [address] [place] [phone]  Self-Help/Support Groups: [place]. of [employer] of support groups [phone] (call for more info)  Narcotics Anonymous (NA) [facility] [address] [place] - 2 meetings at this location  Residential Treatment Programs:  [place]       [place] [address], [place], [place]  [address] [place]  [address] [place], [place] [address] [phone] Admissions: 8am-3pm M-F  Incentives Substance [place]     [address] [place], [place] [address]       [phone]         [facility] [address] Jadene Pierini [place], [place]  [facility] [address] [place], [place]  [facility] - Intensive Outpatient      [address]     [place], [place]         [facility] ([place]     [place], [place] or [phone]  Residential Treatment Services (RTS), Medicaid [place], [place]  [facility] [address]                                               [address] [place] [facility] Resources: [place]  Human Services- [phone]               General Therapy                                                Domenic Schwab, PhD        [address] [place], [place] [address]         [facility]   [address] [place], [place] [address] [phone]  [facility] [address] [place], [place] [address] [phone] Insurance/Medicaid/sponsorship through [facility] and Families                                              [address] [place]                                        [place], [place] [address]    Therapy/tele-psych/case         [place], [place]  [address]  Adolescent/group home/case management [phone]                                           Rosette Reveal PhD       General therapy       Insurance   [phone]         Dr. Adele Schilder, Insurance, M-F [phone]  [facility] Dept. [address]          [address] [place] Phone:  [phone]                                  Phone:  [phone]                   Phone:  [place], [place], [address]  [phone], Insurance  Alcohol Use Disorder Alcohol use disorder is a mental disorder. It is not a one-time incident of heavy drinking. Alcohol use disorder is the excessive and uncontrollable use of alcohol over time that leads to problems with functioning in one or more areas of daily living. People with this disorder risk harming themselves and others when they drink to excess. Alcohol use disorder also can cause other mental disorders, such as mood and anxiety disorders, and serious physical problems. People with alcohol use disorder often misuse other drugs.  Alcohol use disorder is common and widespread. Some people with this disorder drink alcohol to cope with or escape from negative life events. Others drink to relieve chronic pain or symptoms of mental illness. People with a family history of alcohol use disorder are at higher risk of losing control and using alcohol to excess.  Drinking too much alcohol can cause injury, accidents, and health problems. One drink can be too much when you are:  Working.  Pregnant or breastfeeding.  Taking medicines. Ask your doctor.  Driving or planning to drive. SYMPTOMS  Signs and symptoms of alcohol use disorder may include the following:   Consumption ofalcohol inlarger amounts or over a longer period of time than intended.  Multiple unsuccessful attempts to cutdown or control alcohol use.   A great deal of time spent obtaining alcohol, using alcohol, or recovering from the effects of alcohol (hangover).  A  strong desire or urge to use alcohol (cravings).   Continued use of alcohol despite problems at work, school, or home because of alcohol use.   Continued use of alcohol despite problems in relationships because of alcohol use.  Continued use of alcohol in situations when it is physically hazardous, such as driving a car.  Continued use of alcohol despite awareness of a physical or psychological problem that is likely related to alcohol use. Physical problems related to alcohol use can involve the brain, heart, liver, stomach, and intestines. Psychological problems related to alcohol use include intoxication, depression, anxiety, psychosis, delirium, and dementia.   The need for increased amounts of alcohol to achieve the same desired effect, or a decreased effect from the consumption of the same amount of alcohol (tolerance).  Withdrawal symptoms upon reducing or stopping alcohol use, or alcohol use to reduce or avoid withdrawal symptoms. Withdrawal symptoms include:  Racing heart.  Hand tremor.  Difficulty sleeping.  Nausea.  Vomiting.  Hallucinations.  Restlessness.  Seizures. DIAGNOSIS Alcohol use disorder is diagnosed through an assessment by your health care provider. Your health care provider may start by asking three or four questions to screen for excessive or problematic alcohol use. To confirm a diagnosis of alcohol use disorder, at least two symptoms must be present within a 6818-month period. The severity of alcohol use disorder depends on the number of symptoms:  Mild--two or three.  Moderate--four or five.  Severe--six or more. Your health care provider may perform a physical exam or use results from lab tests to see if you have physical problems resulting from alcohol use. Your health care provider may refer you to a mental health professional for evaluation. TREATMENT  Some people with alcohol use disorder are able to reduce their alcohol use to low-risk  levels. Some people with alcohol use disorder need to quit drinking alcohol. When necessary, mental health professionals with specialized training in substance use treatment can help. Your health care provider can help you decide how severe your alcohol use disorder is and what type  of treatment you need. The following forms of treatment are available:   Detoxification. Detoxification involves the use of prescription medicines to prevent alcohol withdrawal symptoms in the first week after quitting. This is important for people with a history of symptoms of withdrawal and for heavy drinkers who are likely to have withdrawal symptoms. Alcohol withdrawal can be dangerous and, in severe cases, cause death. Detoxification is usually provided in a hospital or in-patient substance use treatment facility.  Counseling or talk therapy. Talk therapy is provided by substance use treatment counselors. It addresses the reasons people use alcohol and ways to keep them from drinking again. The goals of talk therapy are to help people with alcohol use disorder find healthy activities and ways to cope with life stress, to identify and avoid triggers for alcohol use, and to handle cravings, which can cause relapse.  Medicines.Different medicines can help treat alcohol use disorder through the following actions:  Decrease alcohol cravings.  Decrease the positive reward response felt from alcohol use.  Produce an uncomfortable physical reaction when alcohol is used (aversion therapy).  Support groups. Support groups are run by people who have quit drinking. They provide emotional support, advice, and guidance. These forms of treatment are often combined. Some people with alcohol use disorder benefit from intensive combination treatment provided by specialized substance use treatment centers. Both inpatient and outpatient treatment programs are available.   This information is not intended to replace advice given to you  by your health care provider. Make sure you discuss any questions you have with your health care provider.   Document Released: 06/17/2004 Document Revised: 05/31/2014 Document Reviewed: 08/17/2012 Elsevier Interactive Patient Education [employer]

## 2016-06-08 IMAGING — CR DG CHEST 2V
2 series · 2 of 2 positions shown · non-contrast
Comparison: 11/12/2013

CLINICAL DATA: Wheezing

EXAM:
CHEST  2 VIEW

[w chest pa]
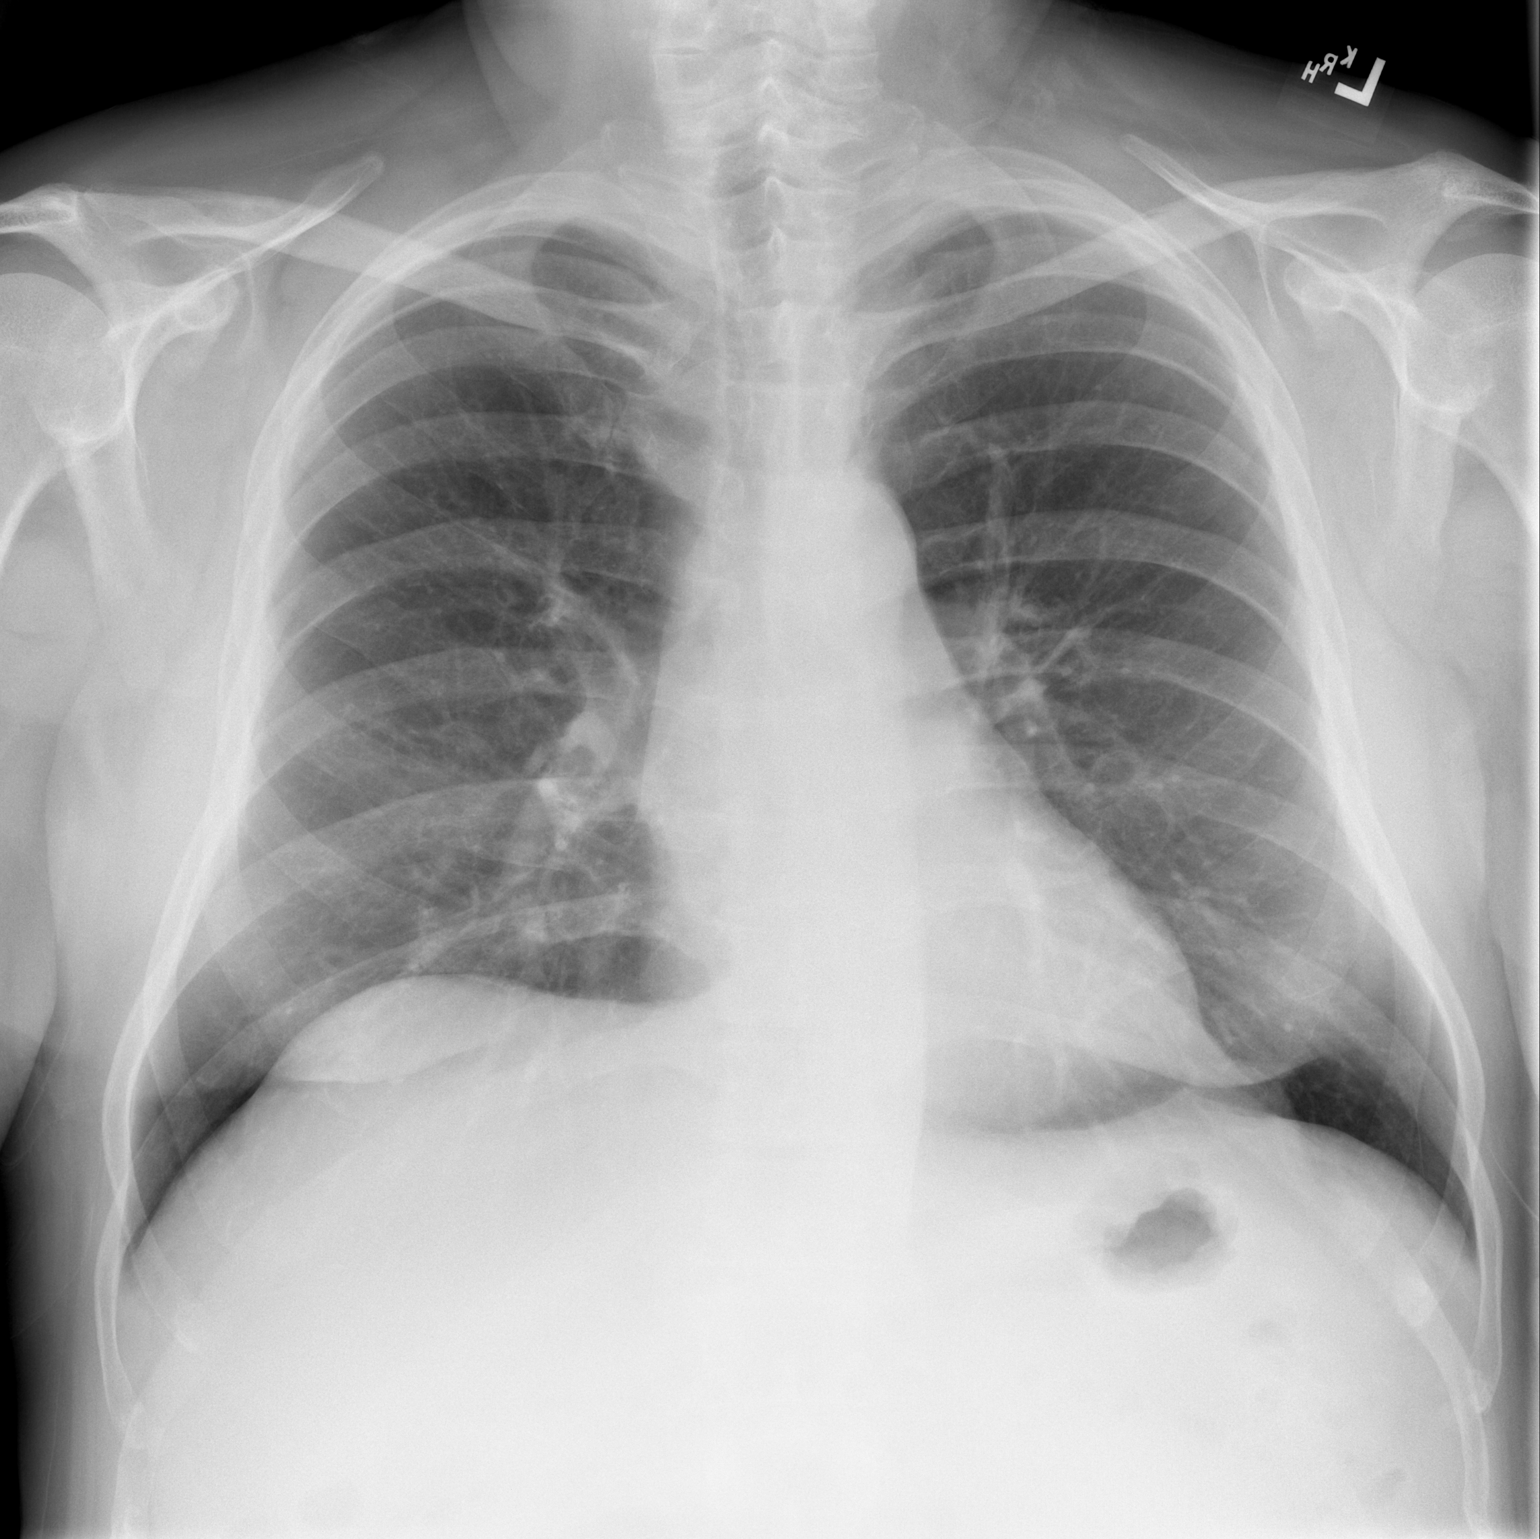

[w chest lat]
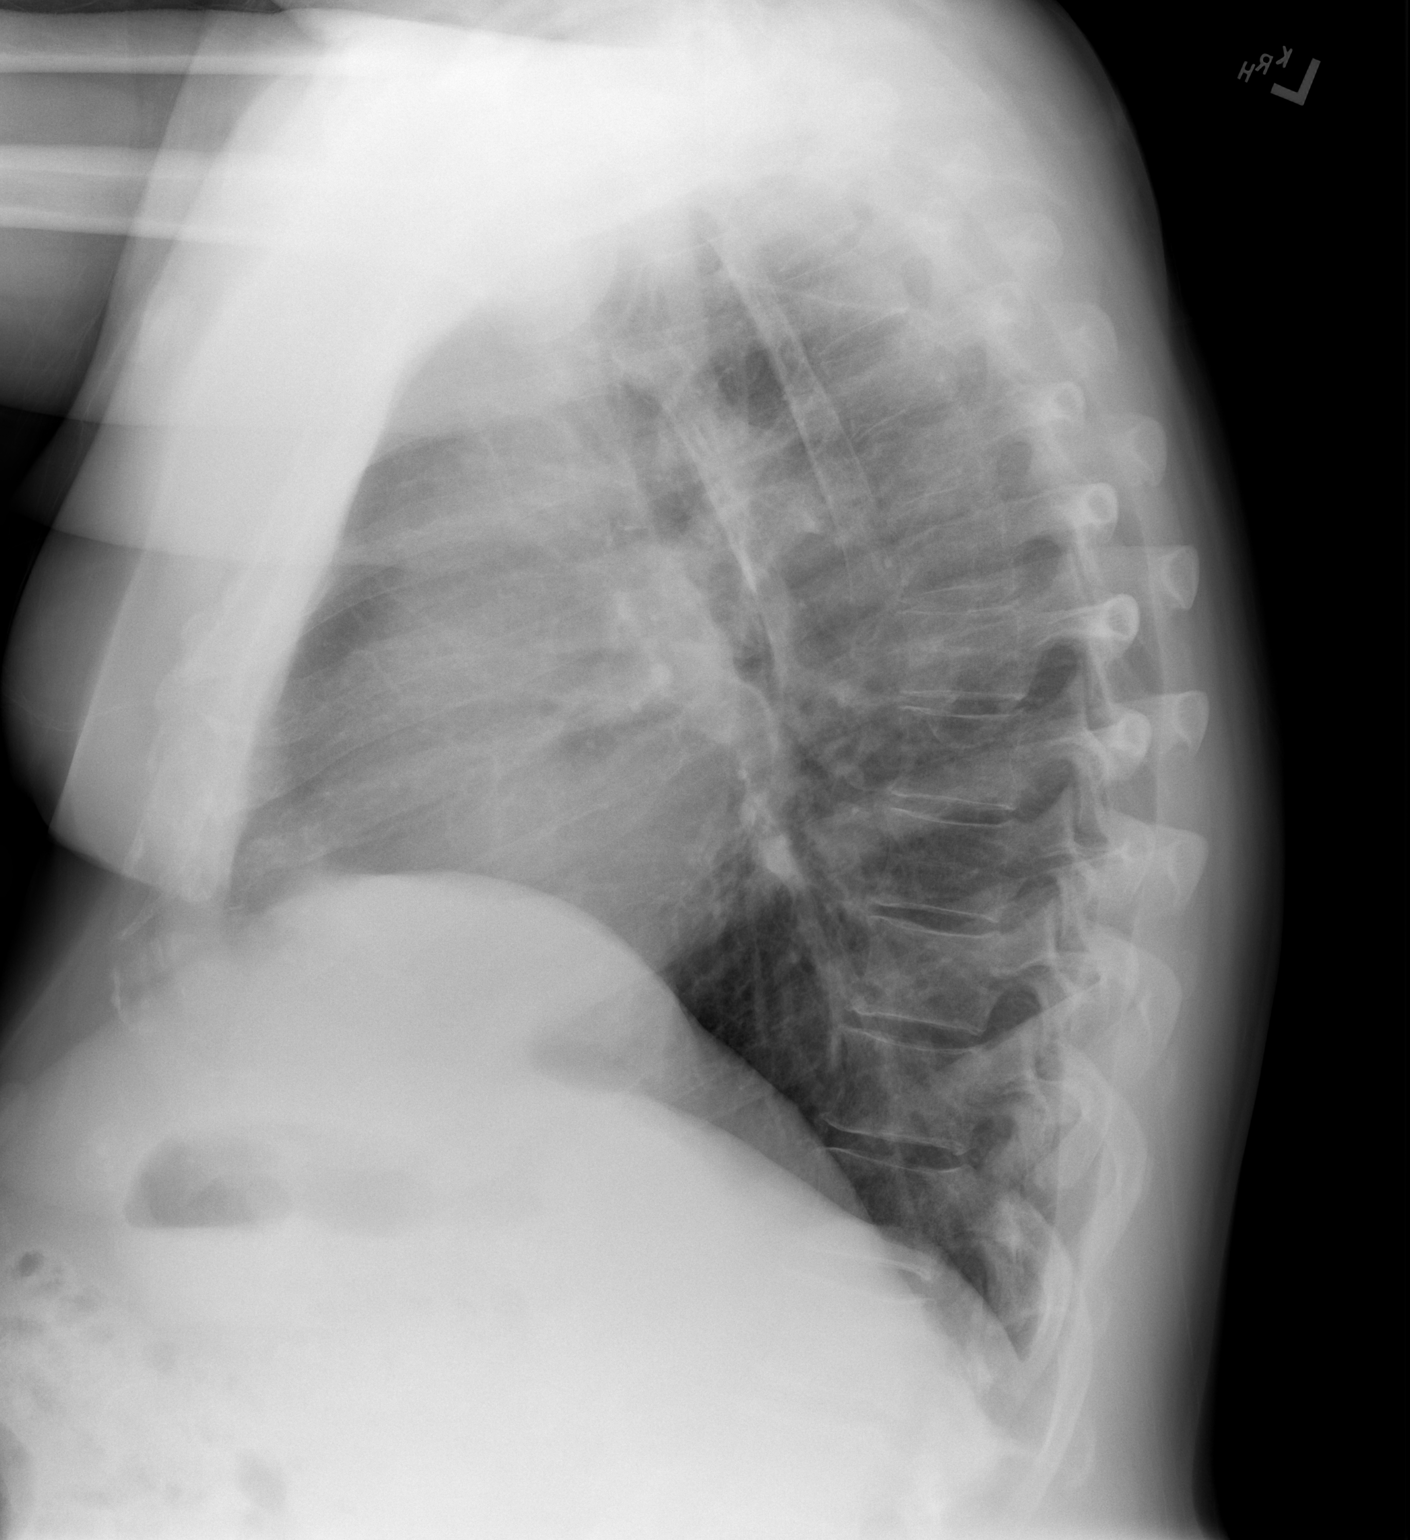

[2 of 2 positions shown; findings below may reference images not displayed]

FINDINGS: Cardiomediastinal silhouette is stable. No acute infiltrate or
pleural effusion. No pulmonary edema. Bony thorax is unremarkable.
IMPRESSION: No active cardiopulmonary disease.
